# Patient Record
Sex: Female | Born: 1955 | Race: White | Hispanic: No | Smoking: Current every day smoker
Health system: Southern US, Community
[De-identification: ages and names within clinical notes are randomized; demographics above are authoritative.]

## PROBLEM LIST (undated history)

## (undated) DIAGNOSIS — R112 Nausea with vomiting, unspecified: Secondary | ICD-10-CM

## (undated) DIAGNOSIS — IMO0001 Reserved for inherently not codable concepts without codable children: Secondary | ICD-10-CM

## (undated) DIAGNOSIS — M1990 Unspecified osteoarthritis, unspecified site: Secondary | ICD-10-CM

## (undated) DIAGNOSIS — F329 Major depressive disorder, single episode, unspecified: Secondary | ICD-10-CM

## (undated) DIAGNOSIS — G4730 Sleep apnea, unspecified: Secondary | ICD-10-CM

## (undated) DIAGNOSIS — J449 Chronic obstructive pulmonary disease, unspecified: Secondary | ICD-10-CM

## (undated) DIAGNOSIS — E039 Hypothyroidism, unspecified: Secondary | ICD-10-CM

## (undated) DIAGNOSIS — K219 Gastro-esophageal reflux disease without esophagitis: Secondary | ICD-10-CM

## (undated) DIAGNOSIS — F32A Depression, unspecified: Secondary | ICD-10-CM

## (undated) DIAGNOSIS — A048 Other specified bacterial intestinal infections: Secondary | ICD-10-CM

## (undated) DIAGNOSIS — Z9889 Other specified postprocedural states: Secondary | ICD-10-CM

## (undated) DIAGNOSIS — I10 Essential (primary) hypertension: Secondary | ICD-10-CM

## (undated) HISTORY — PX: TUBAL LIGATION: SHX77

## (undated) HISTORY — PX: OTHER SURGICAL HISTORY: SHX169

## (undated) HISTORY — PX: JOINT REPLACEMENT: SHX530

## (undated) HISTORY — PX: CHOLECYSTECTOMY: SHX55

## (undated) HISTORY — DX: Other specified bacterial intestinal infections: A04.8

## (undated) HISTORY — PX: COLONOSCOPY: SHX174

## (undated) HISTORY — PX: ABDOMINAL HYSTERECTOMY: SHX81

## (undated) HISTORY — PX: DIAGNOSTIC LAPAROSCOPY: SUR761

## (undated) HISTORY — PX: LEG SURGERY: SHX1003

## (undated) HISTORY — PX: TONSILLECTOMY: SUR1361

## (undated) HISTORY — PX: COLONOSCOPY WITH ESOPHAGOGASTRODUODENOSCOPY (EGD): SHX5779

## (undated) HISTORY — PX: APPENDECTOMY: SHX54

---

## 2007-11-03 ENCOUNTER — Encounter: Admission: RE | Admit: 2007-11-03 | Discharge: 2007-11-03 | Payer: Self-pay | Admitting: Orthopedic Surgery

## 2008-08-10 ENCOUNTER — Inpatient Hospital Stay (HOSPITAL_COMMUNITY): Admission: RE | Admit: 2008-08-10 | Discharge: 2008-08-10 | Payer: Self-pay | Admitting: Orthopedic Surgery

## 2009-03-21 ENCOUNTER — Encounter: Payer: Self-pay | Admitting: Infectious Disease

## 2009-03-27 ENCOUNTER — Encounter: Payer: Self-pay | Admitting: Infectious Disease

## 2009-03-29 ENCOUNTER — Encounter (INDEPENDENT_AMBULATORY_CARE_PROVIDER_SITE_OTHER): Payer: Self-pay | Admitting: *Deleted

## 2009-03-29 DIAGNOSIS — F17200 Nicotine dependence, unspecified, uncomplicated: Secondary | ICD-10-CM

## 2009-04-03 ENCOUNTER — Ambulatory Visit: Payer: Self-pay | Admitting: Infectious Disease

## 2009-04-03 DIAGNOSIS — L819 Disorder of pigmentation, unspecified: Secondary | ICD-10-CM | POA: Insufficient documentation

## 2009-04-03 DIAGNOSIS — F0630 Mood disorder due to known physiological condition, unspecified: Secondary | ICD-10-CM | POA: Insufficient documentation

## 2009-04-03 LAB — CONVERTED CEMR LAB
Basophils Relative: 1 % (ref 0–1)
CO2: 25 meq/L (ref 19–32)
CRP: 0.4 mg/dL (ref <0.6)
Chloride: 104 meq/L (ref 96–112)
Creatinine, Ser: 0.69 mg/dL (ref 0.40–1.20)
Eosinophils Absolute: 0.2 10*3/uL (ref 0.0–0.7)
Eosinophils Relative: 2 % (ref 0–5)
Glucose, Bld: 100 mg/dL — ABNORMAL HIGH (ref 70–99)
Hemoglobin: 15.4 g/dL — ABNORMAL HIGH (ref 12.0–15.0)
Hep B S Ab: NEGATIVE
MCV: 95.8 fL (ref >=78.0)
Monocytes Relative: 9 % (ref 3–12)
Neutro Abs: 3.9 10*3/uL (ref 1.7–7.7)
Neutrophils Relative %: 51 % (ref 43–77)
Potassium: 4.5 meq/L (ref 3.5–5.3)
RDW: 12.9 % (ref 11.5–15.5)
Sed Rate: 18 mm/hr (ref 0–22)
Total Bilirubin: 0.4 mg/dL (ref 0.3–1.2)
Total Protein: 7 g/dL (ref 6.0–8.3)

## 2009-04-06 ENCOUNTER — Telehealth: Payer: Self-pay | Admitting: Infectious Disease

## 2009-04-11 ENCOUNTER — Ambulatory Visit (HOSPITAL_COMMUNITY): Admission: RE | Admit: 2009-04-11 | Discharge: 2009-04-11 | Payer: Self-pay | Admitting: Infectious Disease

## 2009-04-16 ENCOUNTER — Telehealth: Payer: Self-pay | Admitting: Infectious Disease

## 2009-04-23 ENCOUNTER — Telehealth: Payer: Self-pay | Admitting: Infectious Disease

## 2009-04-30 ENCOUNTER — Ambulatory Visit: Payer: Self-pay | Admitting: Infectious Disease

## 2009-04-30 DIAGNOSIS — Z22322 Carrier or suspected carrier of Methicillin resistant Staphylococcus aureus: Secondary | ICD-10-CM

## 2009-04-30 DIAGNOSIS — D751 Secondary polycythemia: Secondary | ICD-10-CM

## 2009-05-02 ENCOUNTER — Encounter: Payer: Self-pay | Admitting: Infectious Disease

## 2009-06-22 ENCOUNTER — Telehealth (INDEPENDENT_AMBULATORY_CARE_PROVIDER_SITE_OTHER): Payer: Self-pay | Admitting: *Deleted

## 2009-07-19 ENCOUNTER — Ambulatory Visit (HOSPITAL_COMMUNITY): Admission: RE | Admit: 2009-07-19 | Discharge: 2009-07-19 | Payer: Self-pay | Admitting: Orthopedic Surgery

## 2009-07-31 ENCOUNTER — Encounter (INDEPENDENT_AMBULATORY_CARE_PROVIDER_SITE_OTHER): Payer: Self-pay | Admitting: Orthopedic Surgery

## 2009-07-31 ENCOUNTER — Ambulatory Visit (HOSPITAL_COMMUNITY): Admission: RE | Admit: 2009-07-31 | Discharge: 2009-07-31 | Payer: Self-pay | Admitting: Orthopedic Surgery

## 2010-03-26 ENCOUNTER — Inpatient Hospital Stay (HOSPITAL_COMMUNITY): Admission: RE | Admit: 2010-03-26 | Discharge: 2010-03-27 | Payer: Self-pay | Admitting: Orthopedic Surgery

## 2010-08-05 ENCOUNTER — Encounter: Payer: Self-pay | Admitting: Orthopedic Surgery

## 2010-08-11 LAB — CONVERTED CEMR LAB
Basophils Absolute: 0 10*3/uL (ref 0.0–0.1)
CO2: 26 meq/L (ref 19–32)
CRP: 0.9 mg/dL — ABNORMAL HIGH (ref <0.6)
Calcium: 9.7 mg/dL (ref 8.4–10.5)
Creatinine, Ser: 0.71 mg/dL (ref 0.40–1.20)
Glucose, Bld: 82 mg/dL (ref 70–99)
Lymphs Abs: 2.5 10*3/uL (ref 0.7–4.0)
MCHC: 32.5 g/dL (ref 30.0–36.0)
Monocytes Absolute: 0.7 10*3/uL (ref 0.1–1.0)
Monocytes Relative: 9 % (ref 3–12)
Platelets: 280 10*3/uL (ref 150–400)
Potassium: 4.7 meq/L (ref 3.5–5.3)
RBC: 4.62 M/uL (ref 3.87–5.11)
RDW: 13.1 % (ref 11.5–15.5)
WBC: 8 10*3/uL (ref 4.0–10.5)

## 2010-08-15 NOTE — Consult Note (Signed)
Summary: [facility]  [facility]   Imported By: Florinda Marker 07/25/2009 14:39:11  _____________________________________________________________________  External Attachment:    Type:   Image     Comment:   External Document

## 2010-08-15 NOTE — Consult Note (Signed)
Summary: [facility]  [facility]   Imported By: Florinda Marker 07/24/2009 14:07:20  _____________________________________________________________________  External Attachment:    Type:   Image     Comment:   External Document

## 2010-09-26 LAB — BASIC METABOLIC PANEL
BUN: 12 mg/dL (ref 6–23)
CO2: 25 mEq/L (ref 19–32)
CO2: 32 mEq/L (ref 19–32)
Calcium: 8.2 mg/dL — ABNORMAL LOW (ref 8.4–10.5)
Calcium: 9.6 mg/dL (ref 8.4–10.5)
Creatinine, Ser: 0.67 mg/dL (ref 0.4–1.2)
GFR calc Af Amer: >60 mL/min (ref >60)
GFR calc Af Amer: >60 mL/min (ref >60)
GFR calc non Af Amer: >60 mL/min (ref >60)
GFR calc non Af Amer: >60 mL/min (ref >60)
Glucose, Bld: 100 mg/dL — ABNORMAL HIGH (ref 70–99)
Glucose, Bld: 147 mg/dL — ABNORMAL HIGH (ref 70–99)

## 2010-09-26 LAB — URINALYSIS, ROUTINE W REFLEX MICROSCOPIC
Bilirubin Urine: NEGATIVE
Glucose, UA: NEGATIVE mg/dL
Ketones, ur: NEGATIVE mg/dL
Nitrite: NEGATIVE
Specific Gravity, Urine: 1.016 (ref 1.005–1.030)

## 2010-09-26 LAB — PROTIME-INR
INR: 0.97 (ref 0.00–1.49)
Prothrombin Time: 13.1 seconds (ref 11.6–15.2)

## 2010-09-26 LAB — DIFFERENTIAL
Basophils Absolute: 0.1 10*3/uL (ref 0.0–0.1)
Basophils Relative: 1 % (ref 0–1)
Eosinophils Absolute: 0.2 10*3/uL (ref 0.0–0.7)
Lymphocytes Relative: 31 % (ref 12–46)
Lymphs Abs: 2.5 10*3/uL (ref 0.7–4.0)
Monocytes Absolute: 0.8 10*3/uL (ref 0.1–1.0)

## 2010-09-26 LAB — TYPE AND SCREEN: ABO/RH(D): O POS

## 2010-09-26 LAB — CBC
HCT: 39.7 % (ref 36.0–46.0)
Hemoglobin: 11.5 g/dL — ABNORMAL LOW (ref 12.0–15.0)
MCH: 33.8 pg (ref 26.0–34.0)
MCHC: 35.2 g/dL (ref 30.0–36.0)
MCHC: 35.4 g/dL (ref 30.0–36.0)
RDW: 12.3 % (ref 11.5–15.5)
WBC: 8.1 10*3/uL (ref 4.0–10.5)

## 2010-09-26 LAB — URINE MICROSCOPIC-ADD ON

## 2010-09-29 LAB — URINE CULTURE: Colony Count: 75000

## 2010-09-29 LAB — ANAEROBIC CULTURE
Gram Stain: NONE SEEN
Gram Stain: NONE SEEN

## 2010-09-29 LAB — GRAM STAIN
Gram Stain: NONE SEEN
Gram Stain: NONE SEEN

## 2010-09-29 LAB — TISSUE CULTURE: Culture: NO GROWTH

## 2010-09-29 LAB — DIFFERENTIAL
Basophils Absolute: 0.1 10*3/uL (ref 0.0–0.1)
Eosinophils Relative: 2 % (ref 0–5)
Lymphocytes Relative: 35 % (ref 12–46)
Lymphocytes Relative: 37 % (ref 12–46)
Lymphs Abs: 3 10*3/uL (ref 0.7–4.0)
Lymphs Abs: 3.4 10*3/uL (ref 0.7–4.0)
Monocytes Absolute: 0.5 10*3/uL (ref 0.1–1.0)
Monocytes Relative: 6 % (ref 3–12)
Neutro Abs: 4.8 10*3/uL (ref 1.7–7.7)
Neutrophils Relative %: 54 % (ref 43–77)

## 2010-09-29 LAB — CBC
HCT: 41.8 % (ref 36.0–46.0)
MCHC: 34.8 g/dL (ref 30.0–36.0)
RBC: 4.32 MIL/uL (ref 3.87–5.11)
RDW: 13.1 % (ref 11.5–15.5)
WBC: 8.8 10*3/uL (ref 4.0–10.5)

## 2010-09-29 LAB — URINALYSIS, ROUTINE W REFLEX MICROSCOPIC
Bilirubin Urine: NEGATIVE
Glucose, UA: NEGATIVE mg/dL
Ketones, ur: NEGATIVE mg/dL
Ketones, ur: NEGATIVE mg/dL
Nitrite: NEGATIVE
Specific Gravity, Urine: 1.015 (ref 1.005–1.030)
pH: 6 (ref 5.0–8.0)
pH: 6 (ref 5.0–8.0)

## 2010-09-29 LAB — COMPREHENSIVE METABOLIC PANEL
ALT: 26 U/L (ref 0–35)
AST: 23 U/L (ref 0–37)
CO2: 31 mEq/L (ref 19–32)
Calcium: 9.5 mg/dL (ref 8.4–10.5)
Calcium: 9.7 mg/dL (ref 8.4–10.5)
Creatinine, Ser: 0.69 mg/dL (ref 0.4–1.2)
GFR calc Af Amer: >60 mL/min (ref >60)
GFR calc non Af Amer: >60 mL/min (ref >60)
Glucose, Bld: 94 mg/dL (ref 70–99)
Sodium: 140 mEq/L (ref 135–145)
Sodium: 140 mEq/L (ref 135–145)
Total Protein: 6.6 g/dL (ref 6.0–8.3)
Total Protein: 7.1 g/dL (ref 6.0–8.3)

## 2010-09-29 LAB — AFB CULTURE WITH SMEAR (NOT AT ARMC): Acid Fast Smear: NONE SEEN

## 2010-09-29 LAB — APTT
aPTT: 37 seconds (ref 24–37)
aPTT: 37 seconds (ref 24–37)

## 2010-09-29 LAB — URINE MICROSCOPIC-ADD ON

## 2010-09-29 LAB — SEDIMENTATION RATE: Sed Rate: 28 mm/hr — ABNORMAL HIGH (ref 0–22)

## 2010-09-29 LAB — HIGH SENSITIVITY CRP: CRP, High Sensitivity: 8.6 mg/L — ABNORMAL HIGH

## 2010-09-29 LAB — PROTIME-INR
INR: 0.93 (ref 0.00–1.49)
Prothrombin Time: 12.4 seconds (ref 11.6–15.2)

## 2010-09-29 LAB — FUNGUS CULTURE W SMEAR

## 2010-09-29 LAB — WOUND CULTURE: Culture: NO GROWTH

## 2010-09-29 LAB — TYPE AND SCREEN: ABO/RH(D): O POS

## 2010-10-28 LAB — URINE CULTURE: Colony Count: >=100000

## 2010-10-28 LAB — CBC
HCT: 40 % (ref 36.0–46.0)
HCT: 41 % (ref 36.0–46.0)
Hemoglobin: 14.4 g/dL (ref 12.0–15.0)
MCV: 92.9 fL (ref 78.0–100.0)
Platelets: 252 10*3/uL (ref 150–400)
RBC: 4.2 MIL/uL (ref 3.87–5.11)
WBC: 7.8 10*3/uL (ref 4.0–10.5)
WBC: 8.4 10*3/uL (ref 4.0–10.5)

## 2010-10-28 LAB — DIFFERENTIAL
Basophils Absolute: 0 10*3/uL (ref 0.0–0.1)
Basophils Relative: 1 % (ref 0–1)
Eosinophils Absolute: 0.1 10*3/uL (ref 0.0–0.7)
Monocytes Relative: 7 % (ref 3–12)
Neutrophils Relative %: 63 % (ref 43–77)

## 2010-10-28 LAB — COMPREHENSIVE METABOLIC PANEL
Alkaline Phosphatase: 95 U/L (ref 39–117)
BUN: 10 mg/dL (ref 6–23)
CO2: 30 mEq/L (ref 19–32)
Chloride: 102 mEq/L (ref 96–112)
GFR calc non Af Amer: >60 mL/min (ref >60)
Glucose, Bld: 108 mg/dL — ABNORMAL HIGH (ref 70–99)
Potassium: 4.3 mEq/L (ref 3.5–5.1)
Total Bilirubin: 0.8 mg/dL (ref 0.3–1.2)

## 2010-10-28 LAB — GRAM STAIN

## 2010-10-28 LAB — ANAEROBIC CULTURE: Gram Stain: NONE SEEN

## 2010-10-28 LAB — TISSUE CULTURE

## 2010-10-28 LAB — URINALYSIS, ROUTINE W REFLEX MICROSCOPIC
Glucose, UA: NEGATIVE mg/dL
Ketones, ur: NEGATIVE mg/dL
Protein, ur: NEGATIVE mg/dL

## 2010-10-28 LAB — BASIC METABOLIC PANEL
BUN: 9 mg/dL (ref 6–23)
Creatinine, Ser: 0.6 mg/dL (ref 0.4–1.2)
GFR calc Af Amer: >60 mL/min (ref >60)
GFR calc non Af Amer: >60 mL/min (ref >60)
Potassium: 4.7 mEq/L (ref 3.5–5.1)

## 2010-10-28 LAB — C-REACTIVE PROTEIN: CRP: 1.1 mg/dL — ABNORMAL HIGH (ref <0.6)

## 2010-10-28 LAB — CULTURE, ROUTINE-ABSCESS: Culture: NO GROWTH

## 2010-10-28 LAB — URINE MICROSCOPIC-ADD ON

## 2010-10-28 LAB — SEDIMENTATION RATE: Sed Rate: 20 mm/hr (ref 0–22)

## 2010-11-26 NOTE — Op Note (Signed)
Jessica Lam, Jessica Lam                  ACCOUNT NO.:  [id number]   MEDICAL RECORD NO.:  [id number]          PATIENT TYPE:  INP   LOCATION:  2899                         FACILITY:  [facility]   PHYSICIAN:  Doralee Albino. Carola Frost, M.D. DATE OF BIRTH:  Dec 12, 1955   DATE OF PROCEDURE:  08/10/2008  DATE OF DISCHARGE:  08/10/2008                               OPERATIVE REPORT   PREOPERATIVE DIAGNOSIS:  Right knee infected hardware.   POSTOPERATIVE DIAGNOSES:  1. Right knee subcutaneous abscesses.  2. Retained hardware, right proximal tibia.   PROCEDURES:  1. Removal of deep implant.  2. Debridement and excision of subcutaneous abscesses.   SURGEON:  Doralee Albino. Carola Frost, MD   ASSISTANT:  Mearl Latin, PA   ANESTHESIA:  General.   COMPLICATIONS:  None.   FINDINGS:  Subcutaneous abscesses, 1 cm x  1 cm (x2).   SPECIMENS:  Three, one from each subcutaneous abscess to micro and one  from the proximal tibia.   GROSS FINDINGS:  No visible evidence of infection directly around the  plate with well-fixed hardware.   ESTIMATED BLOOD LOSS:  50 mL.   DISPOSITION:  PACU.   CONDITION:  Stable.   BRIEF SUMMARY AND INDICATIONS FOR PROCEDURE:  Jessica Lam is a 55-year-  old female status post ORIF of her right tibial plateau fracture nearly  2 years ago.  She had the postoperative course complicated by draining  infection, but ultimately resolved this.  She is continued to have pain  and inflammatory markers were elevated suggestive of infection.  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  There did appear to be areas that perhaps had gone on to  unite, but these were quite small and could be clinically insignificant.  She also has developed changes consistent with posttraumatic arthritis.  We discussed  preoperatively the risk and benefits of hardware removal  with or without bone grafting and attempted repair of her nonunion.  Given the elevated markers, we chose to stage this with removal of  hardware and debridement of what ever infection may be present followed  by repair of the nonunion or perhaps even total knee replacement should  we be convinced the infection is totally resolved up.  Jessica Lam wished  to proceed understanding the risks to include persistent infection, need  for further surgery, failure to improve pain or symptoms, and multiple  others including DVT, heart attack, and stroke.   BRIEF DESCRIPTION OF PROCEDURE:  Jessica Lam was not administered preop  antibiotics, but did receive just a dropper through the IV line before  this was officially held or clamped off.  She was taken to the operating  room where general anesthesia was induced.  The right lower extremity  was prepped and draped in the usual sterile fashion.  Evaluation under  anesthesia did not demonstrate any gross evidence of infection.  Medial  and lateral incisions  were well healed with no fluctuance.  The knee was  also stable to varus-valgus stress at full extension and 30 degrees of  flexion.  She did have nearly a 15-degree flexion contracture.   After thorough prep and drape, we remade the lateral incision and  encountered in the proximal aspect a subcutaneous abscess which measured  at least 1 cm x 1 cm and did have pus within it.  We cultured this, both  anaerobic and aerobic and excised it in its entirety after putting a  Jessica Lam around it.  Further down, we encountered another pocket of  these subcutaneous abscesses and also measuring about 1 cm x 1 cm and we  were suspicious that this could have been an old suture abscess as well.  It was excised in its entirety and then cut on the back table also  revealing purulence.  This material was sent for anaerobic and aerobic  cultures as well.  We  thoroughly irrigated with several liters of saline  and bulb syringe and then changed gloves and applied fresh sterile drape  and then exposed the deep hardware.  The hardware was well fixed and was  removed without difficulty and curettes passed into the bone and the  cavity below the central medial compartment.  No purulence of any kind  was encountered and the bone edges seemed quite stable.  Again, no  evidence of infection, but cultures were obtained from the bone and sent  to micro also.  Deep tissues were copiously irrigated and then 3 deep 0  PDS sutures were used to reapproximate the anterior compartment fascia  at its proximal edge and it was left largely open longitudinally after  again just a tacking suture at the most proximal aspect.  There was no  significant bleeding of any kind and no significant edema or trauma to  the anterior compartment.  The 2-0 PDS was used for the subcu and  staples for the skin.  Sterile gently compressive dressing was applied.  The patient was awakened from anesthesia and transported to PACU in  stable condition.  Montez Morita, PA-C, assisted throughout the procedure  with retraction and also removal of hardware as well as simultaneous  wound closure.   PROGNOSIS:  Jessica Lam will be weightbearing as tolerated with no  restrictions.  We will wait cultures, but we will put her  prophylactically on broad-spectrum oral antibiotics as again this seems  to be a subcutaneous tissue that has been entirely debrided.  We will  recheck inflammatory markers after she has completed this 2-week course  and at that time, we can discuss whether to proceed with repair of her  nonunion versus possibility that her symptoms will improve sufficiently  that she will not request of further surgery.      Doralee Albino. Carola Frost, M.D.  Electronically Signed     [facility]  D:  08/10/2008  T:  08/10/2008  Job:  [id number]

## 2011-09-15 IMAGING — CR DG CHEST 2V
2 series · 2 of 2 positions shown · non-contrast
Comparison: 08/08/2008

CLINICAL DATA: Preoperative chest x-ray for right tibial plateau
nonunion versus osteomyelitis. Smoker with COPD and hypertension

CHEST - 2 VIEW

[view not recorded (1 of 2)]
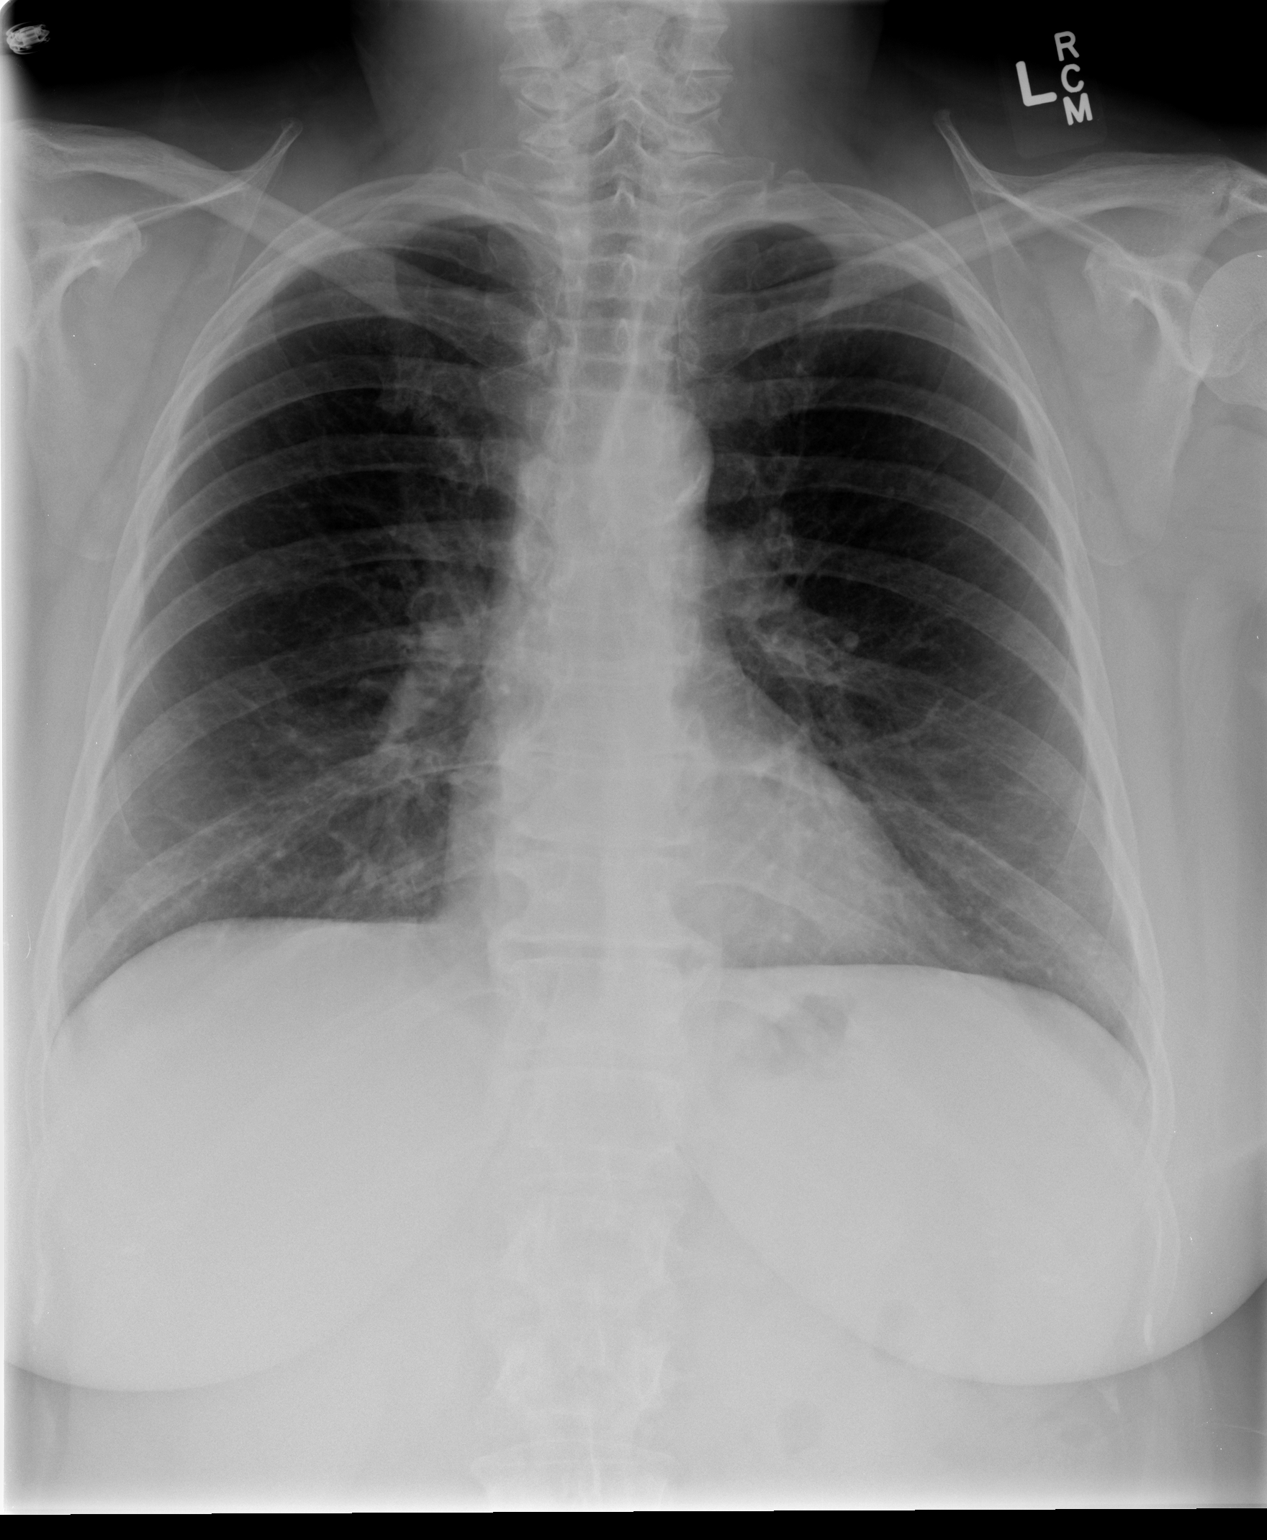

[view not recorded (2 of 2)]
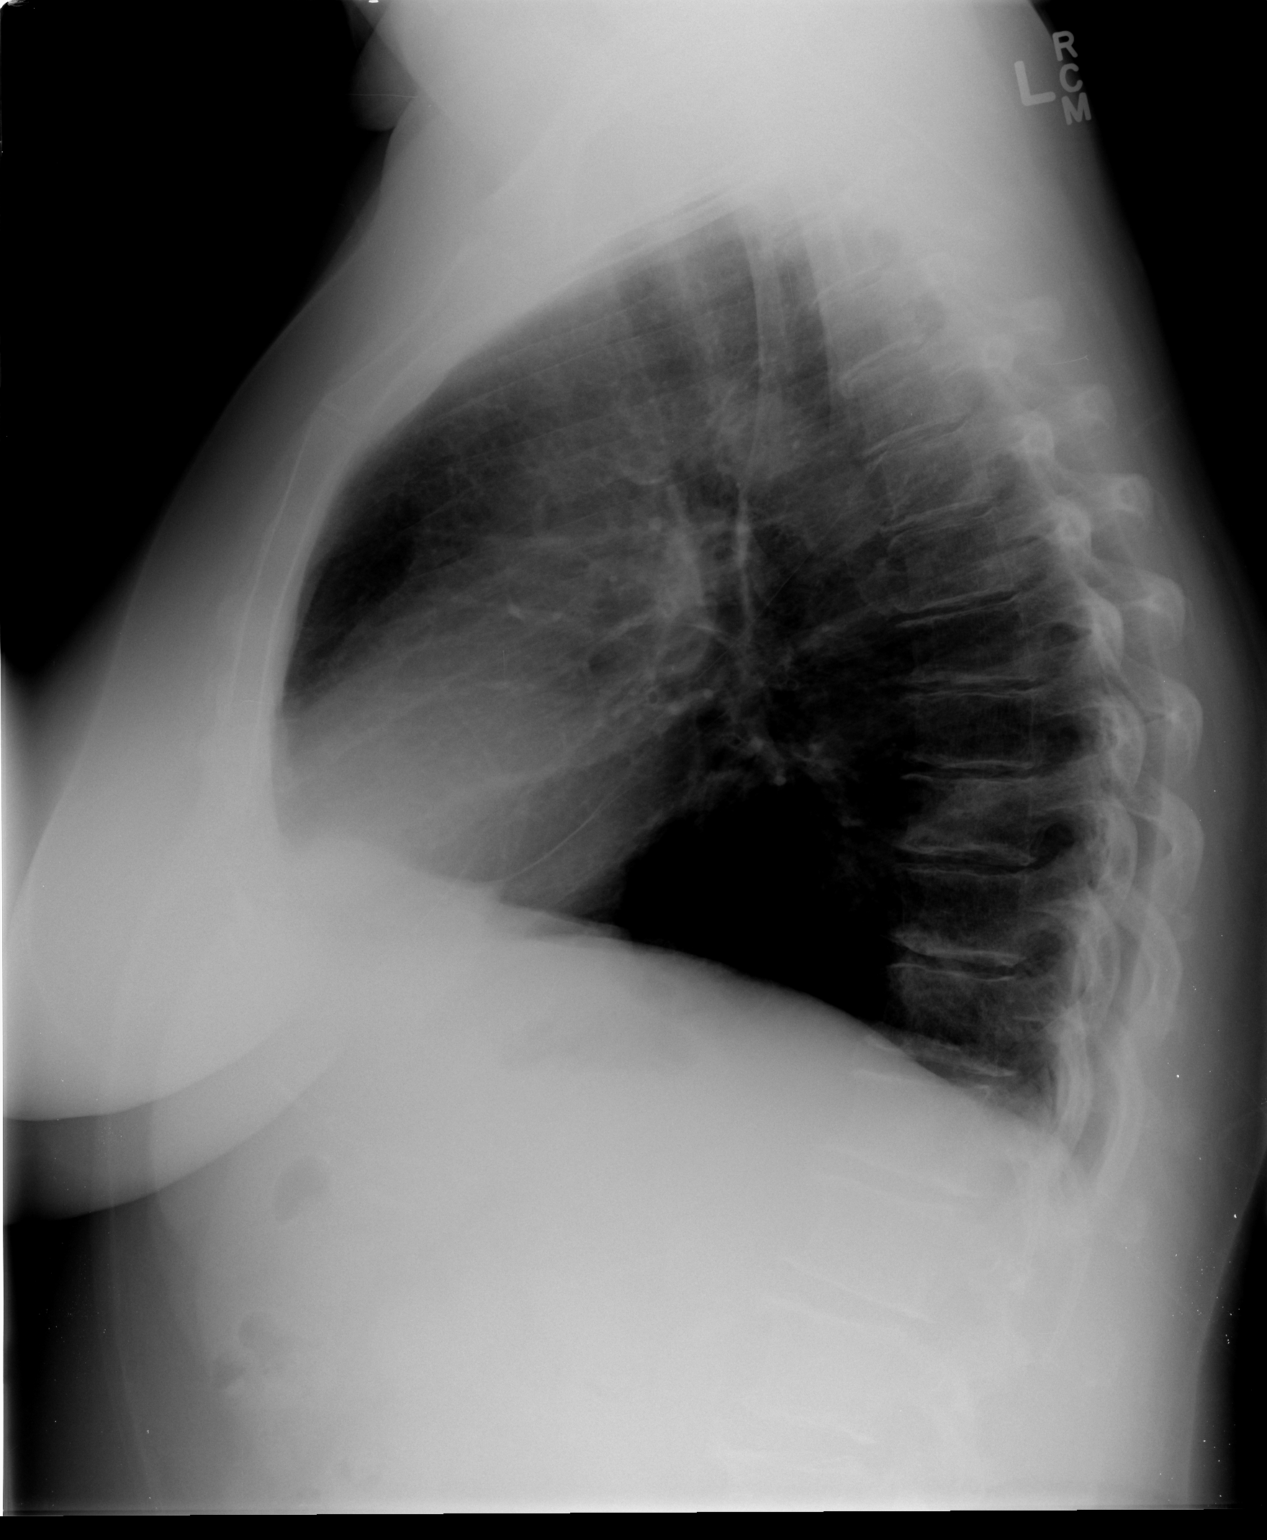

[2 of 2 positions shown; findings below may reference images not displayed]

FINDINGS: Mild COPD changes are present and taking this into
consideration heart size is upper limits of normal.  A normal
mediastinal configuration is seen.

The lung fields are notable for mild peribronchial cuffing which is
stable.  No focal infiltrates or signs of congestive failure seen.
No pleural fluid is noted.

Bony structures demonstrate mild diffuse degenerative osteophytosis
of the thoracic spine and are otherwise intact.
IMPRESSION: Stable cardiopulmonary appearance with no acute abnormality noted.

## 2016-02-11 ENCOUNTER — Encounter (HOSPITAL_COMMUNITY)
Admission: RE | Admit: 2016-02-11 | Discharge: 2016-02-11 | Disposition: A | Discharge status: Home / Self Care | Payer: Medicare Other | Source: Ambulatory Visit | Attending: Orthopedic Surgery | Admitting: Orthopedic Surgery

## 2016-02-11 ENCOUNTER — Encounter (HOSPITAL_COMMUNITY): Payer: Self-pay | Admitting: *Deleted

## 2016-02-11 DIAGNOSIS — Y838 Other surgical procedures as the cause of abnormal reaction of the patient, or of later complication, without mention of misadventure at the time of the procedure: Secondary | ICD-10-CM | POA: Diagnosis not present

## 2016-02-11 DIAGNOSIS — Z01812 Encounter for preprocedural laboratory examination: Secondary | ICD-10-CM | POA: Insufficient documentation

## 2016-02-11 DIAGNOSIS — T84092A Other mechanical complication of internal right knee prosthesis, initial encounter: Secondary | ICD-10-CM | POA: Insufficient documentation

## 2016-02-11 DIAGNOSIS — Z0183 Encounter for blood typing: Secondary | ICD-10-CM | POA: Diagnosis not present

## 2016-02-11 HISTORY — DX: Major depressive disorder, single episode, unspecified: F32.9

## 2016-02-11 HISTORY — DX: Chronic obstructive pulmonary disease, unspecified: J44.9

## 2016-02-11 HISTORY — DX: Unspecified osteoarthritis, unspecified site: M19.90

## 2016-02-11 HISTORY — DX: Essential (primary) hypertension: I10

## 2016-02-11 HISTORY — DX: Other specified postprocedural states: Z98.890

## 2016-02-11 HISTORY — DX: Depression, unspecified: F32.A

## 2016-02-11 HISTORY — DX: Nausea with vomiting, unspecified: R11.2

## 2016-02-11 HISTORY — DX: Sleep apnea, unspecified: G47.30

## 2016-02-11 HISTORY — DX: Reserved for inherently not codable concepts without codable children: IMO0001

## 2016-02-11 LAB — SURGICAL PCR SCREEN
MRSA, PCR: NEGATIVE
Staphylococcus aureus: NEGATIVE

## 2016-02-11 NOTE — Pre-Procedure Instructions (Signed)
EKG 01-22-16, labs -CBC/d, CMP, LP, TSH, VIT D. A1C=6.0. With chart.

## 2016-02-11 NOTE — H&P (Signed)
TOTAL KNEE REVISION ADMISSION H&P  Patient is being admitted for right revision total knee arthroplasty and excision of saphenous nerve.  Subjective:  Chief Complaint:  Right knee pain s/p TKA  HPI: Jessica Lam, 60 y.o. female, has a history of pain and functional disability in the right knee(s) due to failed previous arthroplasty and patient has failed non-surgical conservative treatments for greater than 12 weeks to include NSAID's and/or analgesics, use of assistive devices and activity modification. The indications for the revision of the total knee arthroplasty are loosening of one or more components. Onset of symptoms was gradual starting months ago with rapidlly worsening course since that time.  Prior procedures on the right knee(s) include arthroplasty.  Patient currently rates pain in the right knee(s) at 6 out of 10 with activity. There is worsening of pain with activity and weight bearing, pain that interferes with activities of daily living, pain with passive range of motion, crepitus and joint swelling.  Patient has evidence of prosthetic loosening by imaging studies. This condition presents safety issues increasing the risk of falls.  There is no current active infection.  Risks, benefits and expectations were discussed with the patient.  Risks including but not limited to the risk of anesthesia, blood clots, nerve damage, blood vessel damage, failure of the prosthesis, infection and up to and including death.  Patient understand the risks, benefits and expectations and wishes to proceed with surgery.   PCP: Acey Lav, MD  D/C Plans:      Home  Post-op Meds:       No Rx given  Tranexamic Acid:      To be given - IV  Decadron:      Is to be given  FYI:     ASA  Norco 10-20  Nicotine 14 mg    Patient Active Problem List   Diagnosis Date Noted  . ERYTHROCYTOSIS 04/30/2009  . 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 TATTOOING 04/03/2009  . SMOKER 03/29/2009   No past medical history on file.  No past surgical history on file.  No prescriptions prior to admission.   Allergies  Allergen Reactions  . Codeine   . Penicillins     REACTION: rash in remote past.    Social History  Substance Use Topics  . Smoking status: Not on file  . Smokeless tobacco: Not on file  . Alcohol use Not on file       Review of Systems  Constitutional: Negative.   HENT: Negative.   Eyes: Negative.   Respiratory: Positive for cough and shortness of breath (with exertion).   Cardiovascular: Negative.   Gastrointestinal: Negative.   Genitourinary: Positive for frequency and urgency.  Musculoskeletal: Positive for joint pain.  Skin: Negative.   Neurological: Negative.   Endo/Heme/Allergies: Negative.   Psychiatric/Behavioral: Positive for depression. The patient is nervous/anxious.      Objective:  Physical Exam  Constitutional: She is oriented to person, place, and time. She appears well-developed.  HENT:  Head: Normocephalic.  Eyes: Pupils are equal, round, and reactive to light.  Neck: Neck supple. No JVD present. No tracheal deviation present. No thyromegaly present.  Cardiovascular: Normal rate, regular rhythm, normal heart sounds and intact distal pulses.   Respiratory: Effort normal and breath sounds normal. No respiratory distress. She has no wheezes.  GI: Soft. There is no tenderness. There is no guarding.  Musculoskeletal:  Right knee: She exhibits decreased range of motion, swelling and laceration (healed previous incision). She exhibits no ecchymosis, no deformity and no erythema. Tenderness found.  Lymphadenopathy:    She has no cervical adenopathy.  Neurological: She is alert and oriented to person, place, and time.  Skin: Skin is warm and dry.  Psychiatric: She has a normal mood and affect.      Labs:  Estimated body mass index is 41.38 kg/m as  calculated from the following:   Height as of 04/30/09:  (1.549 m).   Weight as of 04/30/09: 99.3 kg (219 lb).  Imaging Review Plain radiographs demonstrate previous TKA of the right knee(s).  The bone quality appears to be good for age and reported activity level.  Assessment/Plan:  Right knee with failed previous arthroplasty.   The patient history, physical examination, clinical judgment of the provider and imaging studies are consistent with failure of the right knee(s), previous total knee arthroplasty. Revision total knee arthroplasty is deemed medically necessary. The treatment options including medical management, injection therapy, arthroscopy and revision arthroplasty were discussed at length. The risks and benefits of revision total knee arthroplasty were presented and reviewed. The risks due to aseptic loosening, infection, stiffness, patella tracking problems, thromboembolic complications and other imponderables were discussed. The patient acknowledged the explanation, agreed to proceed with the plan and consent was signed. Patient is being admitted for inpatient treatment for surgery, pain control, PT, OT, prophylactic antibiotics, VTE prophylaxis, progressive ambulation and ADL's and discharge planning.The patient is planning to be discharged home.      Anastasio Auerbach Nyeema Want   PA-C  02/11/2016, 9:43 AM

## 2016-02-11 NOTE — Patient Instructions (Signed)
Jessica Lam  02/11/2016   Your procedure is scheduled on: 02-18-16   Report to [facility] Main  Entrance take [facility]  elevators to 3rd floor to  Short Stay Center at  0830 AM.  Call this number if you have problems the morning of surgery [phone]   Remember: ONLY 1 PERSON MAY GO WITH YOU TO SHORT STAY TO GET  READY MORNING OF YOUR SURGERY.  Do not eat food or drink liquids :After Midnight.     Take these medicines the morning of surgery with A SIP OF WATER: Norco, Use Inhaler. Nebulizer as needed. DO NOT TAKE ANY DIABETIC MEDICATIONS DAY OF YOUR SURGERY                               You may not have any metal on your body including hair pins and              piercings  Do not wear jewelry, make-up, lotions, powders or perfumes, deodorant             Do not wear nail polish.  Do not shave  48 hours prior to surgery.              Men may shave face and neck.   Do not bring valuables to the hospital. [facility] IS NOT             RESPONSIBLE   FOR VALUABLES.  Contacts, dentures or bridgework may not be worn into surgery.  Leave suitcase in the car. After surgery it may be brought to your room.     Patients discharged the day of surgery will not be allowed to drive home.  Name and phone number of your driver: Ottis Stain- daughter [phone] cell  Special Instructions: N/A              Please read over the following fact sheets you were given: _____________________________________________________________________             [facility] - Preparing for Surgery Before surgery, you can play an important role.  Because skin is not sterile, your skin needs to be as free of germs as possible.  You can reduce the number of germs on your skin by washing with CHG (chlorahexidine gluconate) soap before surgery.  CHG is an antiseptic cleaner which kills germs and bonds with the skin to continue killing germs even after washing. Please DO NOT use if you have an  allergy to CHG or antibacterial soaps.  If your skin becomes reddened/irritated stop using the CHG and inform your nurse when you arrive at Short Stay. Do not shave (including legs and underarms) for at least 48 hours prior to the first CHG shower.  You may shave your face/neck. Please follow these instructions carefully:  1.  Shower with CHG Soap the night before surgery and the  morning of Surgery.  2.  If you choose to wash your hair, wash your hair first as usual with your  normal  shampoo.  3.  After you shampoo, rinse your hair and body thoroughly to remove the  shampoo.                           4.  Use CHG as you would any other liquid soap.  You  can apply chg directly  to the skin and wash                       Gently with a scrungie or clean washcloth.  5.  Apply the CHG Soap to your body ONLY FROM THE NECK DOWN.   Do not use on face/ open                           Wound or open sores. Avoid contact with eyes, ears mouth and genitals (private parts).                       Wash face,  Genitals (private parts) with your normal soap.             6.  Wash thoroughly, paying special attention to the area where your surgery  will be performed.  7.  Thoroughly rinse your body with warm water from the neck down.  8.  DO NOT shower/wash with your normal soap after using and rinsing off  the CHG Soap.                9.  Pat yourself dry with a clean towel.            10.  Wear clean pajamas.            11.  Place clean sheets on your bed the night of your first shower and do not  sleep with pets. Day of Surgery : Do not apply any lotions/deodorants the morning of surgery.  Please wear clean clothes to the hospital/surgery center.  FAILURE TO FOLLOW THESE INSTRUCTIONS MAY RESULT IN THE CANCELLATION OF YOUR SURGERY PATIENT SIGNATURE_________________________________  NURSE  SIGNATURE__________________________________  ________________________________________________________________________   Adam Phenix  An incentive spirometer is a tool that can help keep your lungs clear and active. This tool measures how well you are filling your lungs with each breath. Taking long deep breaths may help reverse or decrease the chance of developing breathing (pulmonary) problems (especially infection) following:  A long period of time when you are unable to move or be active. BEFORE THE PROCEDURE   If the spirometer includes an indicator to show your best effort, your nurse or respiratory therapist will set it to a desired goal.  If possible, sit up straight or lean slightly forward. Try not to slouch.  Hold the incentive spirometer in an upright position. INSTRUCTIONS FOR USE  1. Sit on the edge of your bed if possible, or sit up as far as you can in bed or on a chair. 2. Hold the incentive spirometer in an upright position. 3. Breathe out normally. 4. Place the mouthpiece in your mouth and seal your lips tightly around it. 5. Breathe in slowly and as deeply as possible, raising the piston or the ball toward the top of the column. 6. Hold your breath for 3-5 seconds or for as long as possible. Allow the piston or ball to fall to the bottom of the column. 7. Remove the mouthpiece from your mouth and breathe out normally. 8. Rest for a few seconds and repeat Steps 1 through 7 at least 10 times every 1-2 hours when you are awake. Take your time and take a few normal breaths between deep breaths. 9. The spirometer may include an indicator to show your best effort. Use the indicator as a goal to work toward during  each repetition. 10. After each set of 10 deep breaths, practice coughing to be sure your lungs are clear. If you have an incision (the cut made at the time of surgery), support your incision when coughing by placing a pillow or rolled up towels firmly  against it. Once you are able to get out of bed, walk around indoors and cough well. You may stop using the incentive spirometer when instructed by your caregiver.  RISKS AND COMPLICATIONS  Take your time so you do not get dizzy or light-headed.  If you are in pain, you may need to take or ask for pain medication before doing incentive spirometry. It is harder to take a deep breath if you are having pain. AFTER USE  Rest and breathe slowly and easily.  It can be helpful to keep track of a log of your progress. Your caregiver can provide you with a simple table to help with this. If you are using the spirometer at home, follow these instructions: [place] IF:   You are having difficultly using the spirometer.  You have trouble using the spirometer as often as instructed.  Your pain medication is not giving enough relief while using the spirometer.  You develop fever of 100.5 F (38.1 C) or higher. SEEK IMMEDIATE MEDICAL CARE IF:   You cough up bloody sputum that had not been present before.  You develop fever of 102 F (38.9 C) or greater.  You develop worsening pain at or near the incision site. MAKE SURE YOU:   Understand these instructions.  Will watch your condition.  Will get help right away if you are not doing well or get worse. Document Released: 11/10/2006 Document Revised: 09/22/2011 Document Reviewed: 01/11/2007 [employer] Patient Information 2014 [employer], [place].   ________________________________________________________________________  WHAT IS A BLOOD TRANSFUSION? Blood Transfusion Information  A transfusion is the replacement of blood or some of its parts. Blood is made up of multiple cells which provide different functions.  Red blood cells carry oxygen and are used for blood loss replacement.  White blood cells fight against infection.  Platelets control bleeding.  Plasma helps clot blood.  Other blood products are available for  specialized needs, such as hemophilia or other clotting disorders. BEFORE THE TRANSFUSION  Who gives blood for transfusions?   Healthy volunteers who are fully evaluated to make sure their blood is safe. This is blood bank blood. Transfusion therapy is the safest it has ever been in the practice of medicine. Before blood is taken from a donor, a complete history is taken to make sure that person has no history of diseases nor engages in risky social behavior (examples are intravenous drug use or sexual activity with multiple partners). The donor's travel history is screened to minimize risk of transmitting infections, such as malaria. The donated blood is tested for signs of infectious diseases, such as HIV and hepatitis. The blood is then tested to be sure it is compatible with you in order to minimize the chance of a transfusion reaction. If you or a relative donates blood, this is often done in anticipation of surgery and is not appropriate for emergency situations. It takes many days to process the donated blood. RISKS AND COMPLICATIONS Although transfusion therapy is very safe and saves many lives, the main dangers of transfusion include:   Getting an infectious disease.  Developing a transfusion reaction. This is an allergic reaction to something in the blood you were given. Every precaution is taken to prevent  this. The decision to have a blood transfusion has been considered carefully by your caregiver before blood is given. Blood is not given unless the benefits outweigh the risks. AFTER THE TRANSFUSION  Right after receiving a blood transfusion, you will usually feel much better and more energetic. This is especially true if your red blood cells have gotten low (anemic). The transfusion raises the level of the red blood cells which carry oxygen, and this usually causes an energy increase.  The nurse administering the transfusion will monitor you carefully for complications. HOME CARE  INSTRUCTIONS  No special instructions are needed after a transfusion. You may find your energy is better. Speak with your caregiver about any limitations on activity for underlying diseases you may have. SEEK MEDICAL CARE IF:   Your condition is not improving after your transfusion.  You develop redness or irritation at the intravenous (IV) site. SEEK IMMEDIATE MEDICAL CARE IF:  Any of the following symptoms occur over the next 12 hours:  Shaking chills.  You have a temperature by mouth above 102 F (38.9 C), not controlled by medicine.  Chest, back, or muscle pain.  People around you feel you are not acting correctly or are confused.  Shortness of breath or difficulty breathing.  Dizziness and fainting.  You get a rash or develop hives.  You have a decrease in urine output.  Your urine turns a dark color or changes to pink, red, or brown. Any of the following symptoms occur over the next 10 days:  You have a temperature by mouth above 102 F (38.9 C), not controlled by medicine.  Shortness of breath.  Weakness after normal activity.  The white part of the eye turns yellow (jaundice).  You have a decrease in the amount of urine or are urinating less often.  Your urine turns a dark color or changes to pink, red, or brown. Document Released: 06/27/2000 Document Revised: 09/22/2011 Document Reviewed: 02/14/2008 [facility] Patient Information 2014 [place], [place].  _______________________________________________________________________

## 2016-02-18 ENCOUNTER — Inpatient Hospital Stay (HOSPITAL_COMMUNITY): Payer: Medicare Other | Admitting: Anesthesiology

## 2016-02-18 ENCOUNTER — Encounter (HOSPITAL_COMMUNITY)
Admission: RE | Disposition: A | Discharge status: Home Health | Payer: Self-pay | Source: Ambulatory Visit | Attending: Orthopedic Surgery

## 2016-02-18 ENCOUNTER — Encounter (HOSPITAL_COMMUNITY): Payer: Self-pay

## 2016-02-18 ENCOUNTER — Inpatient Hospital Stay (HOSPITAL_COMMUNITY)
Admission: RE | Admit: 2016-02-18 | Discharge: 2016-02-19 | DRG: 467 | Disposition: A | Discharge status: Home Health | Payer: Medicare Other | Source: Ambulatory Visit | Attending: Orthopedic Surgery | Admitting: Orthopedic Surgery

## 2016-02-18 DIAGNOSIS — F329 Major depressive disorder, single episode, unspecified: Secondary | ICD-10-CM | POA: Diagnosis present

## 2016-02-18 DIAGNOSIS — T84032A Mechanical loosening of internal right knee prosthetic joint, initial encounter: Secondary | ICD-10-CM | POA: Diagnosis present

## 2016-02-18 DIAGNOSIS — Y792 Prosthetic and other implants, materials and accessory orthopedic devices associated with adverse incidents: Secondary | ICD-10-CM | POA: Diagnosis present

## 2016-02-18 DIAGNOSIS — Z96651 Presence of right artificial knee joint: Secondary | ICD-10-CM

## 2016-02-18 DIAGNOSIS — I10 Essential (primary) hypertension: Secondary | ICD-10-CM | POA: Diagnosis present

## 2016-02-18 DIAGNOSIS — J449 Chronic obstructive pulmonary disease, unspecified: Secondary | ICD-10-CM | POA: Diagnosis present

## 2016-02-18 DIAGNOSIS — Z6841 Body Mass Index (BMI) 40.0 and over, adult: Secondary | ICD-10-CM

## 2016-02-18 DIAGNOSIS — G4730 Sleep apnea, unspecified: Secondary | ICD-10-CM | POA: Diagnosis present

## 2016-02-18 DIAGNOSIS — M25561 Pain in right knee: Secondary | ICD-10-CM | POA: Diagnosis present

## 2016-02-18 DIAGNOSIS — E039 Hypothyroidism, unspecified: Secondary | ICD-10-CM | POA: Diagnosis present

## 2016-02-18 DIAGNOSIS — E669 Obesity, unspecified: Secondary | ICD-10-CM | POA: Diagnosis present

## 2016-02-18 DIAGNOSIS — K219 Gastro-esophageal reflux disease without esophagitis: Secondary | ICD-10-CM | POA: Diagnosis present

## 2016-02-18 DIAGNOSIS — Z96659 Presence of unspecified artificial knee joint: Secondary | ICD-10-CM

## 2016-02-18 HISTORY — PX: TOTAL KNEE REVISION: SHX996

## 2016-02-18 LAB — TYPE AND SCREEN
ABO/RH(D): O POS
Antibody Screen: NEGATIVE

## 2016-02-18 SURGERY — TOTAL KNEE REVISION
Anesthesia: General | Site: Knee | Laterality: Right

## 2016-02-18 MED ORDER — ONDANSETRON HCL 4 MG/2ML IJ SOLN
INTRAMUSCULAR | Status: AC
Start: 2016-02-18 — End: 2016-02-18
  Filled 2016-02-18: qty 2

## 2016-02-18 MED ORDER — NON FORMULARY: 40.0000 mg | Freq: Every day | Status: DC

## 2016-02-18 MED ORDER — METOCLOPRAMIDE HCL 5 MG PO TABS: 5.0000 mg | ORAL_TABLET | Freq: Three times a day (TID) | ORAL | Status: DC | PRN

## 2016-02-18 MED ORDER — DEXAMETHASONE SODIUM PHOSPHATE 10 MG/ML IJ SOLN
10.0000 mg | Freq: Once | INTRAMUSCULAR | Status: AC
  Administered 2016-02-18: 10 mg via INTRAVENOUS

## 2016-02-18 MED ORDER — SODIUM CHLORIDE 0.9 % IJ SOLN
INTRAMUSCULAR | Status: DC | PRN
  Administered 2016-02-18: 30 mL

## 2016-02-18 MED ORDER — HYDROMORPHONE HCL 1 MG/ML IJ SOLN: 0.2500 mg | INTRAMUSCULAR | Status: DC | PRN

## 2016-02-18 MED ORDER — SODIUM CHLORIDE 0.9 % IJ SOLN
INTRAMUSCULAR | Status: AC
  Filled 2016-02-18: qty 50

## 2016-02-18 MED ORDER — KETOROLAC TROMETHAMINE 30 MG/ML IJ SOLN
INTRAMUSCULAR | Status: AC
  Filled 2016-02-18: qty 1

## 2016-02-18 MED ORDER — ALBUTEROL SULFATE HFA 108 (90 BASE) MCG/ACT IN AERS
INHALATION_SPRAY | RESPIRATORY_TRACT | Status: DC | PRN
  Administered 2016-02-18: 4 via RESPIRATORY_TRACT
  Administered 2016-02-18: 2 via RESPIRATORY_TRACT

## 2016-02-18 MED ORDER — FERROUS SULFATE 325 (65 FE) MG PO TABS
325.0000 mg | ORAL_TABLET | Freq: Three times a day (TID) | ORAL | Status: DC
  Administered 2016-02-19: 325 mg via ORAL
  Filled 2016-02-18: qty 1

## 2016-02-18 MED ORDER — MEPERIDINE HCL 50 MG/ML IJ SOLN: 6.2500 mg | INTRAMUSCULAR | Status: DC | PRN

## 2016-02-18 MED ORDER — LACTATED RINGERS IV SOLN: INTRAVENOUS | Status: DC

## 2016-02-18 MED ORDER — LIDOCAINE HCL (CARDIAC) 20 MG/ML IV SOLN
INTRAVENOUS | Status: DC | PRN
  Administered 2016-02-18: 100 mg via INTRAVENOUS

## 2016-02-18 MED ORDER — BUPIVACAINE-EPINEPHRINE (PF) 0.25% -1:200000 IJ SOLN
INTRAMUSCULAR | Status: AC
Start: 2016-02-18 — End: 2016-02-18
  Filled 2016-02-18: qty 30

## 2016-02-18 MED ORDER — DOCUSATE SODIUM 100 MG PO CAPS
100.0000 mg | ORAL_CAPSULE | Freq: Two times a day (BID) | ORAL | Status: DC
  Administered 2016-02-18 – 2016-02-19 (×2): 100 mg via ORAL
  Filled 2016-02-18 (×2): qty 1

## 2016-02-18 MED ORDER — EPHEDRINE SULFATE 50 MG/ML IJ SOLN
INTRAMUSCULAR | Status: DC | PRN
  Administered 2016-02-18: 10 mg via INTRAVENOUS
  Administered 2016-02-18 (×2): 5 mg via INTRAVENOUS

## 2016-02-18 MED ORDER — ASPIRIN 81 MG PO CHEW
81.0000 mg | CHEWABLE_TABLET | Freq: Two times a day (BID) | ORAL | Status: DC
  Administered 2016-02-19: 81 mg via ORAL
  Filled 2016-02-18: qty 1

## 2016-02-18 MED ORDER — CHLORHEXIDINE GLUCONATE 4 % EX LIQD
60.0000 mL | Freq: Once | CUTANEOUS | Status: DC
Start: 2016-02-18 — End: 2016-02-18

## 2016-02-18 MED ORDER — ALBUTEROL SULFATE HFA 108 (90 BASE) MCG/ACT IN AERS
INHALATION_SPRAY | RESPIRATORY_TRACT | Status: AC
  Filled 2016-02-18: qty 6.7

## 2016-02-18 MED ORDER — 0.9 % SODIUM CHLORIDE (POUR BTL) OPTIME
TOPICAL | Status: DC | PRN
  Administered 2016-02-18: 1000 mL

## 2016-02-18 MED ORDER — ALBUTEROL SULFATE HFA 108 (90 BASE) MCG/ACT IN AERS: 1.0000 | INHALATION_SPRAY | Freq: Four times a day (QID) | RESPIRATORY_TRACT | Status: DC | PRN

## 2016-02-18 MED ORDER — FENTANYL CITRATE (PF) 250 MCG/5ML IJ SOLN
INTRAMUSCULAR | Status: AC
  Filled 2016-02-18: qty 5

## 2016-02-18 MED ORDER — CEFAZOLIN SODIUM-DEXTROSE 2-4 GM/100ML-% IV SOLN
INTRAVENOUS | Status: AC
  Filled 2016-02-18: qty 100

## 2016-02-18 MED ORDER — CELECOXIB 200 MG PO CAPS
200.0000 mg | ORAL_CAPSULE | Freq: Two times a day (BID) | ORAL | Status: DC
  Administered 2016-02-18 – 2016-02-19 (×2): 200 mg via ORAL
  Filled 2016-02-18 (×2): qty 1

## 2016-02-18 MED ORDER — SERTRALINE HCL 50 MG PO TABS
100.0000 mg | ORAL_TABLET | Freq: Every day | ORAL | Status: DC
  Administered 2016-02-18: 100 mg via ORAL
  Filled 2016-02-18: qty 2

## 2016-02-18 MED ORDER — ONDANSETRON HCL 4 MG/2ML IJ SOLN
INTRAMUSCULAR | Status: DC | PRN
  Administered 2016-02-18: 4 mg via INTRAVENOUS

## 2016-02-18 MED ORDER — DEXAMETHASONE SODIUM PHOSPHATE 10 MG/ML IJ SOLN
10.0000 mg | Freq: Once | INTRAMUSCULAR | Status: AC
  Administered 2016-02-19: 10 mg via INTRAVENOUS
  Filled 2016-02-18: qty 1

## 2016-02-18 MED ORDER — CEFAZOLIN SODIUM-DEXTROSE 2-4 GM/100ML-% IV SOLN
2.0000 g | Freq: Four times a day (QID) | INTRAVENOUS | Status: AC
Start: 2016-02-18 — End: 2016-02-18
  Administered 2016-02-18 (×2): 2 g via INTRAVENOUS
  Filled 2016-02-18 (×2): qty 100

## 2016-02-18 MED ORDER — MAGNESIUM CITRATE PO SOLN: 1.0000 | Freq: Once | ORAL | Status: DC | PRN

## 2016-02-18 MED ORDER — PROPOFOL 10 MG/ML IV BOLUS
INTRAVENOUS | Status: AC
  Filled 2016-02-18: qty 20

## 2016-02-18 MED ORDER — ESOMEPRAZOLE MAGNESIUM 40 MG PO CPDR
40.0000 mg | DELAYED_RELEASE_CAPSULE | Freq: Every day | ORAL | Status: DC
  Administered 2016-02-19: 40 mg via ORAL
  Filled 2016-02-18: qty 1

## 2016-02-18 MED ORDER — LIDOCAINE HCL (CARDIAC) 20 MG/ML IV SOLN
INTRAVENOUS | Status: AC
  Filled 2016-02-18: qty 5

## 2016-02-18 MED ORDER — ROSUVASTATIN CALCIUM 20 MG PO TABS
40.0000 mg | ORAL_TABLET | Freq: Every day | ORAL | Status: DC
  Administered 2016-02-18: 40 mg via ORAL
  Filled 2016-02-18: qty 2

## 2016-02-18 MED ORDER — METHOCARBAMOL 500 MG PO TABS
500.0000 mg | ORAL_TABLET | Freq: Four times a day (QID) | ORAL | Status: DC | PRN
  Administered 2016-02-19: 500 mg via ORAL
  Filled 2016-02-18: qty 1

## 2016-02-18 MED ORDER — KETOROLAC TROMETHAMINE 30 MG/ML IJ SOLN
INTRAMUSCULAR | Status: DC | PRN
  Administered 2016-02-18: 30 mg

## 2016-02-18 MED ORDER — MENTHOL 3 MG MT LOZG: 1.0000 | LOZENGE | OROMUCOSAL | Status: DC | PRN

## 2016-02-18 MED ORDER — POTASSIUM CHLORIDE 2 MEQ/ML IV SOLN
INTRAVENOUS | Status: DC
  Administered 2016-02-18 – 2016-02-19 (×2): via INTRAVENOUS
  Filled 2016-02-18 (×4): qty 1000

## 2016-02-18 MED ORDER — HYDROCODONE-ACETAMINOPHEN 10-325 MG PO TABS
1.0000 | ORAL_TABLET | ORAL | Status: DC
  Administered 2016-02-18 – 2016-02-19 (×6): 1 via ORAL
  Filled 2016-02-18: qty 2
  Filled 2016-02-18: qty 1
  Filled 2016-02-18: qty 2
  Filled 2016-02-18: qty 1
  Filled 2016-02-18: qty 2
  Filled 2016-02-18: qty 1

## 2016-02-18 MED ORDER — MOMETASONE FURO-FORMOTEROL FUM 200-5 MCG/ACT IN AERO
2.0000 | INHALATION_SPRAY | Freq: Two times a day (BID) | RESPIRATORY_TRACT | Status: DC
  Administered 2016-02-18 – 2016-02-19 (×2): 2 via RESPIRATORY_TRACT
  Filled 2016-02-18: qty 8.8

## 2016-02-18 MED ORDER — ONDANSETRON HCL 4 MG/2ML IJ SOLN: 4.0000 mg | Freq: Four times a day (QID) | INTRAMUSCULAR | Status: DC | PRN

## 2016-02-18 MED ORDER — BUPIVACAINE-EPINEPHRINE 0.25% -1:200000 IJ SOLN
INTRAMUSCULAR | Status: DC | PRN
  Administered 2016-02-18: 30 mL

## 2016-02-18 MED ORDER — SODIUM CHLORIDE 0.9 % IR SOLN
Status: DC | PRN
  Administered 2016-02-18 (×2): 1000 mL

## 2016-02-18 MED ORDER — BISACODYL 10 MG RE SUPP: 10.0000 mg | Freq: Every day | RECTAL | Status: DC | PRN

## 2016-02-18 MED ORDER — CEFAZOLIN SODIUM-DEXTROSE 2-4 GM/100ML-% IV SOLN
2.0000 g | INTRAVENOUS | Status: AC
  Administered 2016-02-18: 2 g via INTRAVENOUS
  Filled 2016-02-18: qty 100

## 2016-02-18 MED ORDER — TRANEXAMIC ACID 1000 MG/10ML IV SOLN
1000.0000 mg | INTRAVENOUS | Status: AC
  Administered 2016-02-18: 1000 mg via INTRAVENOUS
  Filled 2016-02-18: qty 10

## 2016-02-18 MED ORDER — PHENOL 1.4 % MT LIQD: 1.0000 | OROMUCOSAL | Status: DC | PRN

## 2016-02-18 MED ORDER — MIDAZOLAM HCL 2 MG/2ML IJ SOLN
INTRAMUSCULAR | Status: AC
  Filled 2016-02-18: qty 2

## 2016-02-18 MED ORDER — POLYETHYLENE GLYCOL 3350 17 G PO PACK
17.0000 g | PACK | Freq: Two times a day (BID) | ORAL | Status: DC
  Administered 2016-02-19: 17 g via ORAL
  Filled 2016-02-18 (×2): qty 1

## 2016-02-18 MED ORDER — METOCLOPRAMIDE HCL 5 MG/ML IJ SOLN: 5.0000 mg | Freq: Three times a day (TID) | INTRAMUSCULAR | Status: DC | PRN

## 2016-02-18 MED ORDER — MIDAZOLAM HCL 5 MG/5ML IJ SOLN
INTRAMUSCULAR | Status: DC | PRN
  Administered 2016-02-18: 1 mg via INTRAVENOUS
  Administered 2016-02-18: 2 mg via INTRAVENOUS

## 2016-02-18 MED ORDER — LEVOTHYROXINE SODIUM 25 MCG PO TABS
25.0000 ug | ORAL_TABLET | Freq: Every day | ORAL | Status: DC
  Administered 2016-02-18: 25 ug via ORAL
  Filled 2016-02-18: qty 1

## 2016-02-18 MED ORDER — ARFORMOTEROL TARTRATE 15 MCG/2ML IN NEBU
15.0000 ug | INHALATION_SOLUTION | Freq: Two times a day (BID) | RESPIRATORY_TRACT | Status: DC
  Administered 2016-02-18 – 2016-02-19 (×2): 15 ug via RESPIRATORY_TRACT
  Filled 2016-02-18 (×3): qty 2

## 2016-02-18 MED ORDER — TRANEXAMIC ACID 1000 MG/10ML IV SOLN
1000.0000 mg | Freq: Once | INTRAVENOUS | Status: AC
  Administered 2016-02-18: 1000 mg via INTRAVENOUS
  Filled 2016-02-18: qty 10

## 2016-02-18 MED ORDER — HYDROMORPHONE HCL 1 MG/ML IJ SOLN: 0.5000 mg | INTRAMUSCULAR | Status: DC | PRN

## 2016-02-18 MED ORDER — ONDANSETRON HCL 4 MG PO TABS: 4.0000 mg | ORAL_TABLET | Freq: Four times a day (QID) | ORAL | Status: DC | PRN

## 2016-02-18 MED ORDER — PROPOFOL 10 MG/ML IV BOLUS
INTRAVENOUS | Status: DC | PRN
  Administered 2016-02-18: 180 mg via INTRAVENOUS
  Administered 2016-02-18: 50 mg via INTRAVENOUS

## 2016-02-18 MED ORDER — FENTANYL CITRATE (PF) 100 MCG/2ML IJ SOLN
INTRAMUSCULAR | Status: DC | PRN
  Administered 2016-02-18: 100 ug via INTRAVENOUS
  Administered 2016-02-18 (×3): 50 ug via INTRAVENOUS
  Administered 2016-02-18: 100 ug via INTRAVENOUS
  Administered 2016-02-18: 50 ug via INTRAVENOUS

## 2016-02-18 MED ORDER — ALBUTEROL SULFATE (2.5 MG/3ML) 0.083% IN NEBU: 2.5000 mg | INHALATION_SOLUTION | Freq: Four times a day (QID) | RESPIRATORY_TRACT | Status: DC | PRN

## 2016-02-18 MED ORDER — DIPHENHYDRAMINE HCL 25 MG PO CAPS: 25.0000 mg | ORAL_CAPSULE | Freq: Four times a day (QID) | ORAL | Status: DC | PRN

## 2016-02-18 MED ORDER — ALUM & MAG HYDROXIDE-SIMETH 200-200-20 MG/5ML PO SUSP: 30.0000 mL | ORAL | Status: DC | PRN

## 2016-02-18 MED ORDER — PROMETHAZINE HCL 25 MG/ML IJ SOLN: 6.2500 mg | INTRAMUSCULAR | Status: DC | PRN

## 2016-02-18 MED ORDER — METHOCARBAMOL 1000 MG/10ML IJ SOLN
500.0000 mg | Freq: Four times a day (QID) | INTRAVENOUS | Status: DC | PRN
  Filled 2016-02-18: qty 5

## 2016-02-18 MED ORDER — LACTATED RINGERS IV SOLN
INTRAVENOUS | Status: DC
Start: 2016-02-18 — End: 2016-02-18
  Administered 2016-02-18 (×3): via INTRAVENOUS

## 2016-02-18 SURGICAL SUPPLY — 75 items
ADAPTER BOLT FEMORAL +2/-2 (Knees) ×2 IMPLANT
ADPR FEM +2/-2 OFST BOLT (Knees) ×1 IMPLANT
ADPR FEM 5D STRL KN PFC SGM (Orthopedic Implant) ×1 IMPLANT
AUG TIB SZ2 10 REV STP WDG (Knees) ×1 IMPLANT
AUGMENT PFC SIG RP SZ2.5 12.5M (Knees) IMPLANT
BAG DECANTER FOR FLEXI CONT (MISCELLANEOUS) IMPLANT
BAG SPEC THK2 15X12 ZIP CLS (MISCELLANEOUS)
BAG ZIPLOCK 12X15 (MISCELLANEOUS) IMPLANT
BANDAGE ACE 6X5 VEL STRL LF (GAUZE/BANDAGES/DRESSINGS) ×3 IMPLANT
BLADE SAW SGTL 13.0X1.19X90.0M (BLADE) ×3 IMPLANT
BLADE SAW SGTL 81X20 HD (BLADE) ×3 IMPLANT
BONE CEMENT GENTAMICIN (Cement) ×9 IMPLANT
BRUSH FEMORAL CANAL (MISCELLANEOUS) ×2 IMPLANT
CEMENT BONE GENTAMICIN 40 (Cement) IMPLANT
CEMENT RESTRICTOR DEPUY SZ 4 (Cement) ×2 IMPLANT
CLOTH BEACON ORANGE TIMEOUT ST (SAFETY) ×3 IMPLANT
CUFF TOURN SGL QUICK 34 (TOURNIQUET CUFF) ×3
CUFF TRNQT CYL 34X4X40X1 (TOURNIQUET CUFF) ×1 IMPLANT
DRAPE U-SHAPE 47X51 STRL (DRAPES) ×3 IMPLANT
DRESSING AQUACEL AG SP 3.5X10 (GAUZE/BANDAGES/DRESSINGS) IMPLANT
DRSG ADAPTIC 3X8 NADH LF (GAUZE/BANDAGES/DRESSINGS) IMPLANT
DRSG AQUACEL AG ADV 3.5X10 (GAUZE/BANDAGES/DRESSINGS) ×3 IMPLANT
DRSG AQUACEL AG SP 3.5X10 (GAUZE/BANDAGES/DRESSINGS)
DRSG PAD ABDOMINAL 8X10 ST (GAUZE/BANDAGES/DRESSINGS) IMPLANT
DRSG TEGADERM 4X4.75 (GAUZE/BANDAGES/DRESSINGS) IMPLANT
DURAPREP 26ML APPLICATOR (WOUND CARE) ×6 IMPLANT
ELECT REM PT RETURN 9FT ADLT (ELECTROSURGICAL) ×3
ELECTRODE REM PT RTRN 9FT ADLT (ELECTROSURGICAL) ×1 IMPLANT
FEMORAL ADAPTER (Orthopedic Implant) ×2 IMPLANT
FEMUR SIGMA PS SZ 2.5 R (Femur) ×2 IMPLANT
GAUZE SPONGE 2X2 8PLY STRL LF (GAUZE/BANDAGES/DRESSINGS) IMPLANT
GAUZE SPONGE 4X4 12PLY STRL (GAUZE/BANDAGES/DRESSINGS) IMPLANT
GLOVE BIOGEL M 7.0 STRL (GLOVE) IMPLANT
GLOVE BIOGEL PI IND STRL 7.5 (GLOVE) ×1 IMPLANT
GLOVE BIOGEL PI IND STRL 8.5 (GLOVE) ×1 IMPLANT
GLOVE BIOGEL PI INDICATOR 7.5 (GLOVE) ×14
GLOVE BIOGEL PI INDICATOR 8.5 (GLOVE) ×2
GLOVE ECLIPSE 8.0 STRL XLNG CF (GLOVE) ×3 IMPLANT
GLOVE ORTHO TXT STRL SZ7.5 (GLOVE) ×6 IMPLANT
GOWN STRL REUS W/TWL LRG LVL3 (GOWN DISPOSABLE) ×5 IMPLANT
GOWN STRL REUS W/TWL XL LVL3 (GOWN DISPOSABLE) ×5 IMPLANT
HANDPIECE INTERPULSE COAX TIP (DISPOSABLE) ×3
IMMOBILIZER KNEE 20 (SOFTGOODS)
IMMOBILIZER KNEE 20 THIGH 36 (SOFTGOODS) IMPLANT
LIQUID BAND (GAUZE/BANDAGES/DRESSINGS) ×3 IMPLANT
MANIFOLD NEPTUNE II (INSTRUMENTS) ×3 IMPLANT
NS IRRIG 1000ML POUR BTL (IV SOLUTION) ×3 IMPLANT
PACK TOTAL KNEE CUSTOM (KITS) ×3 IMPLANT
PADDING CAST COTTON 6X4 STRL (CAST SUPPLIES) IMPLANT
PFC SIGMA RP STB SZ 2.5 12.5M (Knees) ×3 IMPLANT
POSITIONER SURGICAL ARM (MISCELLANEOUS) ×3 IMPLANT
RESTRICTOR CEMENT SZ 5 C-STEM (Cement) ×2 IMPLANT
SET HNDPC FAN SPRY TIP SCT (DISPOSABLE) ×1 IMPLANT
SET PAD KNEE POSITIONER (MISCELLANEOUS) ×3 IMPLANT
SPONGE GAUZE 2X2 STER 10/PKG (GAUZE/BANDAGES/DRESSINGS)
SPONGE LAP 18X18 X RAY DECT (DISPOSABLE) ×2 IMPLANT
STAPLER VISISTAT 35W (STAPLE) IMPLANT
STEM TIBIA PFC 13X30MM (Stem) ×4 IMPLANT
SUT MNCRL AB 3-0 PS2 18 (SUTURE) ×3 IMPLANT
SUT VIC AB 1 CT1 36 (SUTURE) ×3 IMPLANT
SUT VIC AB 2-0 CT1 27 (SUTURE) ×9
SUT VIC AB 2-0 CT1 TAPERPNT 27 (SUTURE) ×3 IMPLANT
SUT VLOC 180 0 24IN GS25 (SUTURE) ×3 IMPLANT
SYR 50ML LL SCALE MARK (SYRINGE) ×3 IMPLANT
TOWER CARTRIDGE SMART MIX (DISPOSABLE) ×3 IMPLANT
TRAY FOLEY W/METER SILVER 14FR (SET/KITS/TRAYS/PACK) ×3 IMPLANT
TRAY FOLEY W/METER SILVER 16FR (SET/KITS/TRAYS/PACK) ×1 IMPLANT
TRAY SLEEVE CEM ML (Knees) ×4 IMPLANT
TRAY TIB SZ 2 REVISION (Knees) ×2 IMPLANT
TUBE KAMVAC SUCTION (TUBING) IMPLANT
WATER STERILE IRR 1500ML POUR (IV SOLUTION) ×3 IMPLANT
WEDGE STEP 1.5X10MM JOINT (Orthopedic Implant) ×2 IMPLANT
WEDGE SZ 2.0MM (Knees) ×2 IMPLANT
WRAP KNEE MAXI GEL POST OP (GAUZE/BANDAGES/DRESSINGS) ×3 IMPLANT
YANKAUER SUCT BULB TIP 10FT TU (MISCELLANEOUS) IMPLANT

## 2016-02-18 NOTE — Anesthesia Postprocedure Evaluation (Signed)
Anesthesia Post Note  Patient: Jessica Lam  Procedure(s) Performed: Procedure(s) (LRB): RIGHT TOTAL KNEE REVISION WITH SAPHENOUS NERVE EXCISION (Right)  Patient location during evaluation: PACU Anesthesia Type: General Level of consciousness: awake and alert Pain management: pain level controlled Vital Signs Assessment: post-procedure vital signs reviewed and stable Respiratory status: spontaneous breathing, nonlabored ventilation, respiratory function stable and patient connected to nasal cannula oxygen Cardiovascular status: blood pressure returned to baseline and stable Postop Assessment: no signs of nausea or vomiting Anesthetic complications: no    Last Vitals:  Vitals:   02/18/16 1435 02/18/16 1445  BP: (!) 142/81 (!) 142/81  Pulse: 74 74  Resp: 16 16  Temp: 37 C 37 C    Last Pain:  Vitals:   02/18/16 1445  TempSrc: Oral  PainSc:                  Shelton SilvasKevin D Hollis

## 2016-02-18 NOTE — Transfer of Care (Signed)
Immediate Anesthesia Transfer of Care Note  Patient: Jessica Lam  Procedure(s) Performed: Procedure(s): RIGHT TOTAL KNEE REVISION WITH SAPHENOUS NERVE EXCISION (Right)  Patient Location: PACU  Anesthesia Type:General  Level of Consciousness:  sedated, patient cooperative and responds to stimulation  Airway & Oxygen Therapy:Patient Spontanous Breathing and Patient connected to face mask oxgen  Post-op Assessment:  Report given to PACU RN and Post -op Vital signs reviewed and stable  Post vital signs:  Reviewed and stable  Last Vitals:  Vitals:   02/18/16 0806  BP: (!) 171/88  Pulse: 79  Resp: 18  Temp: 36.6 C    Complications: No apparent anesthesia complications

## 2016-02-18 NOTE — Interval H&P Note (Signed)
History and Physical Interval Note:  02/18/2016 8:59 AM  Jessica Lam  has presented today for surgery, with the diagnosis of RIGHT FAILED TOTAL KNEE  The various methods of treatment have been discussed with the patient and family. After consideration of risks, benefits and other options for treatment, the patient has consented to  Procedure(s): RIGHT TOTAL KNEE REVISION WITH SAPHENOUS NERVE EXCISION (Right) as a surgical intervention .  The patient's history has been reviewed, patient examined, no change in status, stable for surgery.  I have reviewed the patient's chart and labs.  Questions were answered to the patient's satisfaction.     Shelda PalLIN,Torris House D

## 2016-02-18 NOTE — Anesthesia Procedure Notes (Signed)
Procedure Name: LMA Insertion Date/Time: 02/18/2016 10:39 AM Performed by: Epimenio SarinJARVELA, Jessica Lam Pre-anesthesia Checklist: Patient identified, Emergency Drugs available, Suction available, Patient being monitored and Timeout performed Patient Re-evaluated:Patient Re-evaluated prior to inductionOxygen Delivery Method: Circle system utilized Preoxygenation: Pre-oxygenation with 100% oxygen Intubation Type: IV induction Ventilation: Mask ventilation without difficulty LMA: LMA with gastric port inserted LMA Size: 3.0 Number of attempts: 1 Dental Injury: Teeth and Oropharynx as per pre-operative assessment

## 2016-02-18 NOTE — Anesthesia Preprocedure Evaluation (Addendum)
Anesthesia Evaluation  Patient identified by MRN, date of birth, ID band Patient awake    Reviewed: Allergy & Precautions, NPO status , Patient's Chart, lab work & pertinent test results  History of Anesthesia Complications (+) PONV and history of anesthetic complications  Airway Mallampati: I  TM Distance: >3 FB Neck ROM: Full    Dental  (+) Edentulous Upper, Edentulous Lower   Pulmonary sleep apnea , COPD,  COPD inhaler, Current Smoker,     + wheezing      Cardiovascular hypertension,  Rhythm:Regular Rate:Normal     Neuro/Psych PSYCHIATRIC DISORDERS Depression negative neurological ROS     GI/Hepatic Neg liver ROS, GERD  Medicated,  Endo/Other  Hypothyroidism   Renal/GU negative Renal ROS  negative genitourinary   Musculoskeletal  (+) Arthritis ,   Abdominal   Peds negative pediatric ROS (+)  Hematology negative hematology ROS (+)   Anesthesia Other Findings   Reproductive/Obstetrics negative OB ROS                           Anesthesia Physical Anesthesia Plan  ASA: III  Anesthesia Plan: General   Post-op Pain Management:    Induction: Intravenous  Airway Management Planned: LMA  Additional Equipment:   Intra-op Plan:   Post-operative Plan: Extubation in OR  Informed Consent: I have reviewed the patients History and Physical, chart, labs and discussed the procedure including the risks, benefits and alternatives for the proposed anesthesia with the patient or authorized representative who has indicated his/her understanding and acceptance.   Dental advisory given  Plan Discussed with: CRNA  Anesthesia Plan Comments: (Pt refused spinal and nerve block.  Edentulous will placed OETT if necessary. Slight wheeze, will administer albuterol in preop. )       Anesthesia Quick Evaluation

## 2016-02-18 NOTE — Evaluation (Signed)
Physical Therapy Evaluation Patient Details Name: Jessica Lam MRN: [id number] DOB: 1956-02-19 Today's Date: 02/18/2016   History of Present Illness  R TKA revision  Clinical Impression  Pt is s/p TKA resulting in the deficits listed below (see PT Problem List). Pt ambulated 8540' with RW, initiated TKA exercises. Good progress expected.  Pt will benefit from skilled PT to increase their independence and safety with mobility to allow discharge to the venue listed below.      Follow Up Recommendations Home health PT    Equipment Recommendations  Rolling walker with 5" wheels;3in1 (PT)    Recommendations for Other Services       Precautions / Restrictions Precautions Precautions: Knee Restrictions Weight Bearing Restrictions: No Other Position/Activity Restrictions: WBAT      Mobility  Bed Mobility Overal bed mobility: Needs Assistance Bed Mobility: Supine to Sit     Supine to sit: Min assist     General bed mobility comments: min A RLE  Transfers Overall transfer level: Needs assistance Equipment used: Rolling walker (2 wheeled) Transfers: Sit to/from Stand Sit to Stand: Min assist         General transfer comment: min A to rise/steady, VCs hand placement  Ambulation/Gait Ambulation/Gait assistance: Min guard Ambulation Distance (Feet): 40 Feet Assistive device: Rolling walker (2 wheeled) Gait Pattern/deviations: Step-to pattern   Gait velocity interpretation: Below normal speed for age/gender General Gait Details: VCs sequencing, steady with RW, no LOB  Stairs            Wheelchair Mobility    Modified Rankin (Stroke Patients Only)       Balance Overall balance assessment: Modified Independent                                           Pertinent Vitals/Pain Pain Assessment: 0-10 Pain Score: 4  Pain Location: R knee Pain Intervention(s): Monitored during session;Limited activity within patient's tolerance;Ice applied     Home Living Family/patient expects to be discharged to:: Private residence Living Arrangements: Other relatives (sister) Available Help at Discharge: Family;Available 24 hours/day   Home Access: Stairs to enter Entrance Stairs-Rails: Can reach both;Left;Right Entrance Stairs-Number of Steps: 5 Home Layout: Two level Home Equipment: Cane - single point      Prior Function Level of Independence: Independent with assistive device(s)         Comments: used cane prn     Hand Dominance        Extremity/Trunk Assessment   Upper Extremity Assessment: Overall WFL for tasks assessed           Lower Extremity Assessment: RLE deficits/detail RLE Deficits / Details: knee ext 3/5, SLR +2/5, ankle WNL    Cervical / Trunk Assessment: Normal  Communication   Communication: No difficulties  Cognition Arousal/Alertness: Awake/alert Behavior During Therapy: WFL for tasks assessed/performed Overall Cognitive Status: Within Functional Limits for tasks assessed                      General Comments      Exercises Total Joint Exercises Ankle Circles/Pumps: AROM;Both;10 reps Quad Sets: AROM;Both;5 reps;Supine Heel Slides: AAROM;Right;10 reps;Supine Long Arc Quad: AROM;Right;5 reps;Seated Goniometric ROM: 5-45*      Assessment/Plan    PT Assessment Patient needs continued PT services  PT Diagnosis Difficulty walking;Acute pain   PT Problem List Decreased strength;Decreased mobility;Pain  PT Treatment Interventions  DME instruction;Gait training;Functional mobility training;Therapeutic activities;Therapeutic exercise;Patient/family education   PT Goals (Current goals can be found in the Care Plan section) Acute Rehab PT Goals Patient Stated Goal: to go fishing PT Goal Formulation: With patient Time For Goal Achievement: 02/25/16 Potential to Achieve Goals: Good    Frequency 7X/week   Barriers to discharge        Co-evaluation               End  of Session Equipment Utilized During Treatment: Gait belt Activity Tolerance: Patient tolerated treatment well Patient left: in chair;with call bell/phone within reach;with chair alarm set;with family/visitor present Nurse Communication: Mobility status         Time: 6295-2841 PT Time Calculation (min) (ACUTE ONLY): 18 min   Charges:   PT Evaluation $PT Eval Low Complexity: 1 Procedure     PT G CodesTamala Ser 02/18/2016, 4:27 PM [phone]

## 2016-02-19 LAB — BASIC METABOLIC PANEL
ANION GAP: 8 (ref 5–15)
BUN: 10 mg/dL (ref 6–20)
CALCIUM: 8.4 mg/dL — AB (ref 8.9–10.3)
CO2: 27 mmol/L (ref 22–32)
CREATININE: 0.71 mg/dL (ref 0.44–1.00)
Chloride: 104 mmol/L (ref 101–111)
Glucose, Bld: 137 mg/dL — ABNORMAL HIGH (ref 65–99)
Potassium: 4.3 mmol/L (ref 3.5–5.1)
SODIUM: 139 mmol/L (ref 135–145)

## 2016-02-19 LAB — CBC
HCT: 35.2 % — ABNORMAL LOW (ref 36.0–46.0)
Hemoglobin: 11.8 g/dL — ABNORMAL LOW (ref 12.0–15.0)
MCH: 31.1 pg (ref 26.0–34.0)
MCHC: 33.5 g/dL (ref 30.0–36.0)
MCV: 92.9 fL (ref 78.0–100.0)
Platelets: 192 10*3/uL (ref 150–400)
RBC: 3.79 MIL/uL — ABNORMAL LOW (ref 3.87–5.11)
RDW: 13.1 % (ref 11.5–15.5)
WBC: 12.5 10*3/uL — AB (ref 4.0–10.5)

## 2016-02-19 MED ORDER — ASPIRIN 81 MG PO CHEW
81.0000 mg | CHEWABLE_TABLET | Freq: Two times a day (BID) | ORAL | 0 refills | Status: DC
Start: 2016-02-19 — End: 2016-08-18

## 2016-02-19 MED ORDER — POLYETHYLENE GLYCOL 3350 17 G PO PACK: 17.0000 g | PACK | Freq: Two times a day (BID) | ORAL | 0 refills | Status: DC

## 2016-02-19 MED ORDER — DOCUSATE SODIUM 100 MG PO CAPS: 100.0000 mg | ORAL_CAPSULE | Freq: Two times a day (BID) | ORAL | 0 refills | Status: DC

## 2016-02-19 MED ORDER — CYCLOBENZAPRINE HCL 10 MG PO TABS: 10.0000 mg | ORAL_TABLET | Freq: Three times a day (TID) | ORAL | 0 refills | Status: DC | PRN

## 2016-02-19 MED ORDER — HYDROCODONE-ACETAMINOPHEN 10-325 MG PO TABS: 1.0000 | ORAL_TABLET | ORAL | 0 refills | Status: DC | PRN

## 2016-02-19 MED ORDER — FERROUS SULFATE 325 (65 FE) MG PO TABS: 325.0000 mg | ORAL_TABLET | Freq: Three times a day (TID) | ORAL | 3 refills | Status: DC

## 2016-02-19 NOTE — Brief Op Note (Signed)
02/18/2016  9:25 AM  PATIENT:  Jessica Lam  60 y.o. female  PRE-OPERATIVE DIAGNOSIS:  RIGHT FAILED TOTAL KNEE replacement  POST-OPERATIVE DIAGNOSIS:  RIGHT FAILED TOTAL KNEE replacement  PROCEDURE:  Procedure(s): RIGHT TOTAL KNEE REVISION   SURGEON:  Surgeon(s) and Role:    * Durene RomansMatthew Ersa Delaney, MD - Primary  PHYSICIAN ASSISTANT: Lanney GinsMatthew Babish, PA-C  ANESTHESIA:   general  EBL:  Total I/O In: 480 [P.O.:480] Out: 300 [Urine:300]  BLOOD ADMINISTERED:none  DRAINS: none   LOCAL MEDICATIONS USED:  MARCAINE     SPECIMEN:  No Specimen  DISPOSITION OF SPECIMEN:  N/A  COUNTS:  YES  TOURNIQUET:   Total Tourniquet Time Documented: Thigh (Right) - 65 minutes Total: Thigh (Right) - 65 minutes   DICTATION: .Other Dictation: Dictation Number [id number]  PLAN OF CARE: Admit to inpatient   PATIENT DISPOSITION:  PACU - hemodynamically stable.   Delay start of Pharmacological VTE agent (>24hrs) due to surgical blood loss or risk of bleeding: no

## 2016-02-19 NOTE — Care Management Note (Signed)
Case Management Note  Patient Details  Name: Jessica Lam MRN: [id number] Date of Birth: 09-Aug-1955  Subjective/Objective:                  RIGHT TOTAL KNEE REVISION WITH SAPHENOUS NERVE EXCISION (Right) Action/Plan: Discharge planning Expected Discharge Date:                  Expected Discharge Plan:  [place]  In-House Referral:     Discharge planning Services  CM Consult  Post Acute Care Choice:  Home Health Choice offered to:  Patient  DME Arranged:  Walker rolling with seat, Tub bench DME Agency:  [place]:  PT [place] Agency:  [facility] ([facility])  Status of Service:  Completed, signed off  If discussed at [employer], dates discussed:    Additional Comments: CM met with pt to offer choice of home health agency.  Pt chooses [facility] to render HHPT.  Referral given to [facility] rep, Tim.  CM called [facility] DME rep, Jermaine to please delive rthe tub transfer bench and rolllator to room prior to discharge today.  No other CM needs were communicated. Dellie Catholic, RN 02/19/2016, 10:42 AM

## 2016-02-19 NOTE — Progress Notes (Signed)
Physical Therapy Treatment Patient Details Name: Jessica Lam MRN: [id number] DOB: 1956/06/28 Today's Date: 02/19/2016    History of Present Illness R TKA revision    PT Comments    POD # 1 Assisted with amb in hallway, practiced stairs (4 steps B rails and one step with walker), given handout HEP and instructed on use of ICE.  Pt ready for D/C to home.    Follow Up Recommendations  Home health PT     Equipment Recommendations  Rolling walker with 5" wheels;3in1 (PT)    Recommendations for Other Services       Precautions / Restrictions Precautions Precautions: Knee Restrictions Weight Bearing Restrictions: No Other Position/Activity Restrictions: WBAT    Mobility  Bed Mobility Overal bed mobility: Modified Independent             General bed mobility comments: increased time  Transfers Overall transfer level: Needs assistance Equipment used: Rolling walker (2 wheeled) Transfers: Sit to/from BJ'sStand;Stand Pivot Transfers Sit to Stand: Modified independent (Device/Increase time)         General transfer comment: good use of B UE's to steady self and proper turn completion  Ambulation/Gait Ambulation/Gait assistance: Modified independent (Device/Increase time) Ambulation Distance (Feet): 52 Feet Assistive device: Rolling walker (2 wheeled) Gait Pattern/deviations: Step-through pattern;Decreased stance time - right Gait velocity: WFL   General Gait Details: one VC on safety with turns using walker throughout completion   Stairs Stairs: Yes Stairs assistance: Min guard Stair Management: Two rails;No rails Number of Stairs: 5 General stair comments: 4 steps forward usining B rails and one step forward using walker with < 25% VC's on proper tech and safety  Wheelchair Mobility    Modified Rankin (Stroke Patients Only)       Balance                                    Cognition Arousal/Alertness: Awake/alert Behavior During Therapy:  WFL for tasks assessed/performed Overall Cognitive Status: Within Functional Limits for tasks assessed                      Exercises      General Comments        Pertinent Vitals/Pain Pain Assessment: 0-10 Pain Score: 2  Pain Location: R knee Pain Descriptors / Indicators: Aching;Tender Pain Intervention(s): Monitored during session;Ice applied;Repositioned    Home Living Family/patient expects to be discharged to:: Private residence Living Arrangements: Other relatives Available Help at Discharge: Family;Available 24 hours/day   Home Access: Stairs to enter Entrance Stairs-Rails: Can reach both;Left;Right Home Layout: Two level Home Equipment: Cane - single point      Prior Function Level of Independence: Independent with assistive device(s)      Comments: used cane prn   PT Goals (current goals can now be found in the care plan section) Progress towards PT goals: Progressing toward goals    Frequency  7X/week    PT Plan Current plan remains appropriate    Co-evaluation             End of Session Equipment Utilized During Treatment: Gait belt Activity Tolerance: Patient tolerated treatment well Patient left: in chair;with call bell/phone within reach;with chair alarm set;with family/visitor present     Time: 4098-11911022-1046 PT Time Calculation (min) (ACUTE ONLY): 24 min  Charges:  $Gait Training: 8-22 mins $Therapeutic Activity: 8-22 mins  G Codes:      Rica Koyanagi  PTA [facility]  Acute  Rehab Pager      [phone]

## 2016-02-19 NOTE — Discharge Summary (Signed)
Physician Discharge Summary  Patient ID: Jessica Lam MRN: [id number] DOB/AGE: 58-Aug-1957 58 y.o.  Admit date: 02/18/2016 Discharge date: 02/19/2016   Procedures:  Procedure(s) (LRB): RIGHT TOTAL KNEE REVISION WITH SAPHENOUS NERVE EXCISION (Right)  Attending Physician:  Dr. Durene Romans   Admission Diagnoses:   Right knee pain s/p TKA  Discharge Diagnoses:  Principal Problem:   S/P right TKA rev Active Problems:   S/P revision of total knee  Past Medical History:  Diagnosis Date  . Arthritis    DDD -back and srthritis right knee  . COPD (chronic obstructive pulmonary disease) (HCC)   . Depression   . Gas    lots of gas and bloating issues  . Hypertension    med was discontinued over a yr-"Blood pressure too low"  . PONV (postoperative nausea and vomiting)   . Sleep apnea    no cpap used    HPI:    Jessica Lam, 60 y.o. female, has a history of pain and functional disability in the right knee(s) due to failed previous arthroplasty and patient has failed non-surgical conservative treatments for greater than 12 weeks to include NSAID's and/or analgesics, use of assistive devices and activity modification. The indications for the revision of the total knee arthroplasty are loosening of one or more components. Onset of symptoms was gradual starting months ago with rapidlly worsening course since that time.  Prior procedures on the right knee(s) include arthroplasty. Patient currently rates pain in the right knee(s) at 6 out of 10 with activity. There is worsening of pain with activity and weight bearing, pain that interferes with activities of daily living, pain with passive range of motion, crepitus and joint swelling.  Patient has evidence of prosthetic loosening by imaging studies. This condition presents safety issues increasing the risk of falls.  There is no current active infection.  Risks, benefits and expectations were discussed with the patient.  Risks including but not  limited to the risk of anesthesia, blood clots, nerve damage, blood vessel damage, failure of the prosthesis, infection and up to and including death.  Patient understand the risks, benefits and expectations and wishes to proceed with surgery.   PCP: Acey Lav, MD   Discharged Condition: good  Hospital Course:  Patient underwent the above stated procedure on 02/18/2016. Patient tolerated the procedure well and brought to the recovery room in good condition and subsequently to the floor.  POD #1 BP: 149/71 ; Pulse: 63 ; Temp: 97.9 F (36.6 C) ; Resp: 18 Patient reports pain as mild to moderate, appears to be under good control as moving knee well already.  No events noted. Neurovascular intact and incision: dressing C/D/I  LABS  Basename    HGB     11.8  HCT     35.2    Discharge Exam: General appearance: alert, cooperative and no distress Extremities: Homans sign is negative, no sign of DVT, no edema, redness or tenderness in the calves or thighs and no ulcers, gangrene or trophic changes  Disposition: Home with follow up in 2 weeks   Follow-up Information    NEDEFF,DAVID, MD .   Specialty:  Orthopedic Surgery Contact information: [address] [place] [address] [phone]        [facility] .   Why:  Now known as [facility] will provide your physical therapy service Contact information: [address] [place] [address] [phone]  Inc[facility] - Dme [facility] .   Why:  rollator and tub transfer bench Contact information: [address] [place] [address] [phone]        Shelda PalLIN,Diamonte Stavely D, MD. Schedule an appointment as soon as possible for a visit in 2 week(s).   Specialty:  Orthopedic Surgery Contact information: [address] [place] [address] [phone]           Discharge Instructions    Call MD / Call 911    Complete by:  As directed   If you  experience chest pain or shortness of breath, CALL 911 and be transported to the hospital emergency room.  If you develope a fever above 101 F, pus (white drainage) or increased drainage or redness at the wound, or calf pain, call your surgeon's office.   Change dressing    Complete by:  As directed   Maintain surgical dressing until follow up in the clinic. If the edges start to pull up, may reinforce with tape. If the dressing is no longer working, may remove and cover with gauze and tape, but must keep the area dry and clean.  Call with any questions or concerns.   Constipation Prevention    Complete by:  As directed   Drink plenty of fluids.  Prune juice may be helpful.  You may use a stool softener, such as Colace (over the counter) 100 mg twice a day.  Use MiraLax (over the counter) for constipation as needed.   Diet - low sodium heart healthy    Complete by:  As directed   Discharge instructions    Complete by:  As directed   Maintain surgical dressing until follow up in the clinic. If the edges start to pull up, may reinforce with tape. If the dressing is no longer working, may remove and cover with gauze and tape, but must keep the area dry and clean.  [facility]. Call with any questions or concerns.   Increase activity slowly as tolerated    Complete by:  As directed   Weight bearing as tolerated with assist device (walker, cane, etc) as directed, use it as long as suggested by your surgeon or therapist, typically at least 4-6 weeks.   TED hose    Complete by:  As directed   Use stockings (TED hose) for 2 weeks on both leg(s).  You may remove them at night for sleeping.        Medication List    STOP taking these medications   aspirin 325 MG tablet Replaced by:  aspirin 81 MG chewable tablet     TAKE these medications   ADVAIR DISKUS 250-50 MCG/DOSE Aepb Generic drug:  Fluticasone-Salmeterol Take 1 puff by mouth daily as needed (for shortness  of breath).   albuterol 108 (90 Base) MCG/ACT inhaler Commonly known as:  PROVENTIL HFA;VENTOLIN HFA Inhale 1-2 puffs into the lungs every 6 (six) hours as needed for wheezing or shortness of breath.   albuterol (2.5 MG/3ML) 0.083% nebulizer solution Commonly known as:  PROVENTIL Take 2.5 mg by nebulization every 6 (six) hours as needed for wheezing or shortness of breath.   aspirin 81 MG chewable tablet Chew 1 tablet (81 mg total) by mouth 2 (two) times daily. Take for 4 weeks, then resume regular dose. Replaces:  aspirin 325 MG tablet   cyclobenzaprine 10 MG tablet Commonly known as:  FLEXERIL Take 1 tablet (10 mg total) by  mouth 3 (three) times daily as needed for muscle spasms.   docusate sodium 100 MG capsule Commonly known as:  COLACE Take 1 capsule (100 mg total) by mouth 2 (two) times daily.   esomeprazole 40 MG capsule Commonly known as:  NEXIUM Take 40 mg by mouth at bedtime.   ferrous sulfate 325 (65 FE) MG tablet Take 1 tablet (325 mg total) by mouth 3 (three) times daily after meals.   HYDROcodone-acetaminophen 10-325 MG tablet Commonly known as:  NORCO Take 1-2 tablets by mouth every 4 (four) hours as needed. What changed:  how much to take  when to take this  reasons to take this   levothyroxine 25 MCG tablet Commonly known as:  SYNTHROID, LEVOTHROID Take 25 mcg by mouth at bedtime.   PERFOROMIST 20 MCG/2ML nebulizer solution Generic drug:  formoterol Take 20 mcg by nebulization daily as needed (for shortness of breath).   polyethylene glycol packet Commonly known as:  MIRALAX / GLYCOLAX Take 17 g by mouth 2 (two) times daily.   rosuvastatin 40 MG tablet Commonly known as:  CRESTOR Take 40 mg by mouth at bedtime.   sertraline 100 MG tablet Commonly known as:  ZOLOFT Take 100 mg by mouth at bedtime.   Vitamin D (Ergocalciferol) 50000 units Caps capsule Commonly known as:  DRISDOL Take 50,000 Units by mouth every Tuesday.         Signed: Anastasio Auerbach. Sparrow Sanzo   PA-C  02/19/2016, 3:48 PM

## 2016-02-19 NOTE — Progress Notes (Addendum)
Patient ID: Jessica Lam, female   DOB: Dec 11, 1955, 60 y.o.   MRN: [id number] Subjective: 1 Day Post-Op Procedure(s) (LRB): RIGHT TOTAL KNEE REVISION WITH SAPHENOUS NERVE EXCISION (Right)    Patient reports pain as mild to moderate, appears to be under good control as moving knee well already.  No events noted  Objective:   VITALS:   Vitals:   02/19/16 0100 02/19/16 0502  BP: (!) 147/68 (!) 149/71  Pulse: 65 63  Resp: 16 18  Temp: 98.2 F (36.8 C) 97.9 F (36.6 C)    Neurovascular intact Incision: dressing C/D/I  LABS  Recent Labs  02/19/16 0518  HGB 11.8*  HCT 35.2*  WBC 12.5*  PLT 192     Recent Labs  02/19/16 0518  NA 139  K 4.3  BUN 10  CREATININE 0.71  GLUCOSE 137*    No results for input(s): LABPT, INR in the last 72 hours.   Assessment/Plan: 1 Day Post-Op Procedure(s) (LRB): RIGHT TOTAL KNEE REVISION WITH SAPHENOUS NERVE EXCISION (Right)   Advance diet Up with therapy Discharge home with home health after therapy  Reviewed findings Reviewed goals   Obese (BMI 30-39.9) Estimated body mass index is 38.23 kg/m as calculated from the following:   Height as of this encounter: 5\' 2"  (1.575 m).   Weight as of this encounter: 94.8 kg (209 lb). Patient also counseled that weight may inhibit the healing process Patient counseled that losing weight will help with future health issues

## 2016-02-19 NOTE — Discharge Instructions (Signed)

## 2016-02-19 NOTE — Op Note (Signed)
NAMErnie Lam:  Torbeck, Angelis                  ACCOUNT NO.:  [id number]  MEDICAL RECORD NO.:  [id number]  LOCATION:  1610                         [facility]  PHYSICIAN:  Madlyn FrankelMatthew D. Charlann Boxerlin, M.D.  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  PROCEDURE:  Revision right total knee arthroplasty.  COMPONENTS USED:  DePuy revision knee system with a size 2-1/2 posterior stabilized femur with a +2 bolt, 5 degree adapter, 13 x 30 cemented stem.  The tibia size was a size 2, MBT revision tray with 10 mm medial and lateral augments.  On the medial side, I used a size 1-1/2 augment.  We used a 29 cemented sleeve on the tibia side as well.  I used a 12.5 posterior stabilized insert and did not revise the patella.  SURGEON:  Madlyn FrankelMatthew D. Charlann Boxerlin, M.D.  ASSISTANT:  Lanney GinsMatthew Babish, PA-C.  Note that Mr. Jessica SailsBabish was present for the entirety of the case from preoperative positioning, perioperative management of the operative extremity, general facilitation of the case, and primary wound closure.  ANESTHESIA:  General.  COMPLICATIONS:  None.  DRAINS:  None.  BLOOD LOSS:  Less than 100 mL once the tourniquet was let down.  INDICATIONS FOR PROCEDURE:  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  She had presented recently with increasing pain.  She had problems with range of motion in the postoperative period requiring  manipulation.  Radiographic and lab workup revealed no signs of infection.  Bone scan revealed increased uptake in both the tibial femoral components.  Based on these findings, her exam, as well as her lab work, there is not any clinical concern for infection at this point.  However, she was having persistent mechanical-related pain.  She wished at this point to proceed with more definitive measures.  We discussed the risk of infection, DVT, component failure, need for future revision surgeries, particularly in the setting of early revision surgery.  We discussed and stressed again the importance of her physical therapy and range of motion efforts.  PROCEDURE IN DETAIL:  The patient was brought to the operative theater. Once adequate anesthesia, preoperative antibiotics, Ancef administered, she was positioned supine with a right thigh tourniquet placed.  The right lower extremity was then prepped and draped in sterile fashion and [facility].  Time-out was performed identifying the patient, planned procedure, and extremity.  The right lower extremity was exsanguinated, tourniquet elevated to 250 mmHg.  Her old incision was excised, soft tissue plane was created medially and laterally.  A median arthrotomy was then made encountering clear synovial fluid.  Initial exposure was carried out in  the medial and lateral aspects of the joint recreating the gutters and suprapatellar pouch.  This allowed for patella subluxation.  The knee was then flexed and using sharp osteotomes, the tibial tray and polyethylene insert were removed.  At this point, using a thin ACL saw, the bone cement interface was undermined on both the tibial and femoral components.  The femoral component was removed without significant bone loss as was the tibial component.  At this point, remaining cement was removed off the proximal tibia.  I then opened up both canals and hand reamed.   On the tibia side, I was only able to ream to about 13 mm, but on the femoral side I was able to get 15 mm reamer in.  Once the reaming was carried out, I used an extramedullary guide and revisited the proximal tibial cut recreating 2 degrees of posterior slope for the MBT revision tray.  We then sized the cut surface of the tibia to be a size 2, confirmed it was perpendicular in the coronal plane.  I then drilled and keel punched this and then broached for 29 cemented sleeve.  At this point, I placed the initial trial tibial tray and placed a 5 mm augment, which would later be changed based on our trial reduction.  At this point, attention was directed to the femur.  This femoral cut was rechecked, no bone was needed to be resected.  I then revisited the cuts from downsize from a size 3 to a 2-1/2 femoral component.  The anterior, posterior, and chamfer cuts were then revisited using +2 adapter to prevent overstuffing anteriorly.  I then revisited the box cut.  At this point, we did a trial reduction with a standard 2-1/2 femur and the tibial tray already in place.  With this, even with a 15 mm augment, there was slight bit of hyperextension.  This matched the flexion gap.  Given these findings, I removed the trial components.  We elected to use the components noted above.  No augments were used on the distal femur based on the extension/flexion assessment. I did select to use 13 x 30 stem that provided further support due to this early loosening.  At this point, the final components were opened and configured on the back table.  The final cement was mixed.  We then placed cement restrictors in the distal femur, proximal tibia.  The knee was irrigated with normal saline solution.  At this point, the final components were cemented into place.  The knee was brought to extension with a 12.5 insert.  The knee came to full extension, had excellent balance in flexion.  Once the cement  had fully cured, excessive cement was removed throughout the knee.  The final 12.5 posterior stabilized insert was then placed into the tibial tray and the knee reduced.  We irrigated the knee throughout the case again at this point.  At this point, the extensor mechanism was reapproximated using #1 Vicryl and 0 V-Loc sutures.  The remainder of the wound was closed with 2-0 Vicryl and a running 3-0 Monocryl.  The knee was then cleaned, dried, and dressed sterilely using surgical glue and Aquacel dressing.  She tolerated the procedure well.  Findings were reviewed with family.  Plan was for her to stay in the hospital 1 to 2 days.  Weightbearing as tolerated.  Range of motion initiated.     Madlyn Frankel Charlann Boxer, M.D.     MDO/MEDQ  D:  02/19/2016  T:  02/19/2016  Job:  [id number]

## 2016-02-19 NOTE — Progress Notes (Signed)
Occupational Therapy Evaluation Patient Details Name: Jessica Lam MRN: [id number] DOB: 1955-09-27 Today's Date: 02/19/2016    History of Present Illness R TKA revision   Clinical Impression   All OT education completed and pt questions answered. No further OT needs at this time. Will sign off.    Follow Up Recommendations  No OT follow up;Supervision/Assistance - 24 hour    Equipment Recommendations  Tub/shower bench    Recommendations for Other Services       Precautions / Restrictions Precautions Precautions: Knee Restrictions Weight Bearing Restrictions: No Other Position/Activity Restrictions: WBAT      Mobility Bed Mobility                Transfers Overall transfer level: Needs assistance Equipment used: Rolling walker (2 wheeled) Transfers: Sit to/from Stand;Stand Pivot Transfers Sit to Stand: Modified independent (Device/Increase time)         General transfer comment: good use of B UE's to steady self and proper turn completion    Balance                                            ADL Overall ADL's : Needs assistance/impaired Eating/Feeding: Independent   Grooming: Set up;Sitting           Upper Body Dressing : Set up;Sitting   Lower Body Dressing: Minimal assistance;Sit to/from stand   Toilet Transfer: Supervision/safety;Ambulation;RW   Toileting- Clothing Manipulation and Hygiene: Supervision/safety;Sit to/from stand   Tub/ Engineer, structuralhower Transfer: Tub Metallurgisttransfer Tub/Shower Transfer Details (indicate cue type and reason): verbal education of transfer technique with tub bench Functional mobility during ADLs: Supervision/safety;Rolling walker       Vision     Perception     Praxis      Pertinent Vitals/Pain Pain Assessment: 0-10 Pain Score: 2  Pain Location: R knee Pain Descriptors / Indicators: Aching;Tender Pain Intervention(s): Monitored during session;Ice applied;Repositioned     Hand Dominance      Extremity/Trunk Assessment Upper Extremity Assessment Upper Extremity Assessment: Overall WFL for tasks assessed   Lower Extremity Assessment Lower Extremity Assessment: Defer to PT evaluation       Communication Communication Communication: No difficulties   Cognition Arousal/Alertness: Awake/alert Behavior During Therapy: WFL for tasks assessed/performed Overall Cognitive Status: Within Functional Limits for tasks assessed                     General Comments       Exercises       Shoulder Instructions      Home Living Family/patient expects to be discharged to:: Private residence Living Arrangements: Other relatives Available Help at Discharge: Family;Available 24 hours/day   Home Access: Stairs to enter Entrance Stairs-Number of Steps: 5 Entrance Stairs-Rails: Can reach both;Left;Right Home Layout: Two level Alternate Level Stairs-Number of Steps: 13   Bathroom Shower/Tub: Chief Strategy OfficerTub/shower unit   Bathroom Toilet: Standard     Home Equipment: Cane - single point          Prior Functioning/Environment Level of Independence: Independent with assistive device(s)        Comments: used cane prn    OT Diagnosis: Acute pain   OT Problem List: Decreased strength;Decreased range of motion;Decreased activity tolerance;Pain;Decreased knowledge of use of DME or AE   OT Treatment/Interventions:      OT Goals(Current goals can be found in the care plan section)  Acute Rehab OT Goals Patient Stated Goal: to go home today OT Goal Formulation: All assessment and education complete, DC therapy  OT Frequency:     Barriers to D/C:            Co-evaluation              End of Session Equipment Utilized During Treatment: Rolling walker  Activity Tolerance: Patient tolerated treatment well Patient left: in bed;with call bell/phone within reach;with family/visitor present   Time: 1101-1111 OT Time Calculation (min): 10 min Charges:  OT General  Charges $OT Visit: 1 Procedure OT Evaluation $OT Eval Low Complexity: 1 Procedure G-Codes:    Evadene Wardrip A March 08, 2016, 1:38 PM

## 2016-02-21 DIAGNOSIS — E669 Obesity, unspecified: Secondary | ICD-10-CM | POA: Diagnosis present

## 2016-07-09 NOTE — H&P (Signed)
Jessica Lam is an 60 y.o. female.    Chief Complaint:    S/P right TKA with excessive scarring and patellar clunk  Procedure:    Excision / debridement of scar right TKA  HPI: Pt is a 60 y.o. female complaining of stiffness, crepitus and pain since the TKA. Pain had continually increased since the beginning. X-rays in the clinic show a right TKA. Pt has tried various conservative treatments which have failed to alleviate their symptoms, including analgesic medications.  Patient previously stopped PT after the TKA, I have stressed the importance of PT and getting better after the next surgery.   Various options are discussed with the patient. Risks, benefits and expectations were discussed with the patient. Patient understand the risks, benefits and expectations and wishes to proceed with surgery.   PCP: Acey Lavornelius Van Dam, MD  D/C Plans:       Home  Post-op Meds:       No Rx given   Tranexamic Acid:      To be given - IV  Decadron:      Is to be given  FYI:     ASA  Norco  NO RX FOR ANALGESIC MEDS UPON DISCHARGE   PMH: Past Medical History:  Diagnosis Date  . Arthritis    DDD -back and srthritis right knee  . COPD (chronic obstructive pulmonary disease) (HCC)   . Depression   . Gas    lots of gas and bloating issues  . Hypertension    med was discontinued over a yr-"Blood pressure too low"  . PONV (postoperative nausea and vomiting)   . Sleep apnea    no cpap used    PSH: Past Surgical History:  Procedure Laterality Date  . ABDOMINAL HYSTERECTOMY     Abdominal  . APPENDECTOMY    . CHOLECYSTECTOMY    . DIAGNOSTIC LAPAROSCOPY     "scar tissue"  . JOINT REPLACEMENT     RTKA  . LEG SURGERY Right    ORIF with plates and screws and removal, x2 surgeries I & D "infection"  . LSO Left    left Salpingo oophorectomy  . TONSILLECTOMY    . TOTAL KNEE REVISION Right 02/18/2016   Procedure: RIGHT TOTAL KNEE REVISION WITH SAPHENOUS NERVE EXCISION;  Surgeon: Durene RomansMatthew Olin, MD;   Location: [facility];  Service: Orthopedics;  Laterality: Right;  . TUBAL LIGATION      Social History:  reports that she has been smoking Cigarettes.  She has a 45.00 pack-year smoking history. She uses smokeless tobacco. She reports that she does not drink alcohol or use drugs.  Allergies:  Allergies  Allergen Reactions  . Codeine Nausea And Vomiting  . Shellfish Allergy Swelling    Swelling of the tongue   . Sulfa Antibiotics Other (See Comments)    Pt can not remember - just knows she got very sick  . Penicillins Rash    Has patient had a PCN reaction causing immediate rash, facial/tongue/throat swelling, SOB or lightheadedness with hypotension: Unknown Has patient had a PCN reaction causing severe rash involving mucus membranes or skin necrosis: Unknown Has patient had a PCN reaction that required hospitalization: Unknown Has patient had a PCN reaction occurring within the last 10 years: No  If all of the above answers are "NO", then may proceed with Cephalosporin use.     Medications: No current facility-administered medications for this encounter.    Current Outpatient Prescriptions  Medication Sig Dispense Refill  . ADVAIR  DISKUS 250-50 MCG/DOSE AEPB Take 1 puff by mouth daily as needed (for shortness of breath).     Marland Kitchen. albuterol (PROVENTIL HFA;VENTOLIN HFA) 108 (90 Base) MCG/ACT inhaler Inhale 1-2 puffs into the lungs every 6 (six) hours as needed for wheezing or shortness of breath.    Marland Kitchen. albuterol (PROVENTIL) (2.5 MG/3ML) 0.083% nebulizer solution Take 2.5 mg by nebulization every 6 (six) hours as needed for wheezing or shortness of breath.    Marland Kitchen. aspirin 81 MG chewable tablet Chew 1 tablet (81 mg total) by mouth 2 (two) times daily. Take for 4 weeks, then resume regular dose. (Patient taking differently: Chew 81 mg by mouth daily. ) 60 tablet 0  . esomeprazole (NEXIUM) 40 MG capsule Take 40 mg by mouth at bedtime.    . formoterol (PERFOROMIST) 20 MCG/2ML nebulizer solution Take  20 mcg by nebulization daily as needed (for shortness of breath).    Marland Kitchen. HYDROcodone-acetaminophen (NORCO) 10-325 MG tablet Take 1-2 tablets by mouth every 4 (four) hours as needed. 100 tablet 0  . ibuprofen (ADVIL,MOTRIN) 800 MG tablet Take 800 mg by mouth every 8 (eight) hours as needed for mild pain or moderate pain.    Marland Kitchen. levothyroxine (SYNTHROID, LEVOTHROID) 25 MCG tablet Take 25 mcg by mouth at bedtime.    . rosuvastatin (CRESTOR) 40 MG tablet Take 40 mg by mouth at bedtime.    . sertraline (ZOLOFT) 100 MG tablet Take 100 mg by mouth at bedtime.       Review of Systems  Constitutional: Negative.   HENT: Negative.   Eyes: Negative.   Respiratory: Negative.   Cardiovascular: Negative.   Gastrointestinal: Negative.   Genitourinary: Negative.   Musculoskeletal: Positive for back pain and joint pain.  Skin: Negative.   Neurological: Negative.   Endo/Heme/Allergies: Negative.   Psychiatric/Behavioral: Positive for depression.      Physical Exam  Constitutional: She is oriented to person, place, and time. She appears well-developed.  HENT:  Head: Normocephalic.  Eyes: Pupils are equal, round, and reactive to light.  Neck: Neck supple. No JVD present. No tracheal deviation present. No thyromegaly present.  Cardiovascular: Normal rate, regular rhythm, normal heart sounds and intact distal pulses.   Respiratory: Effort normal and breath sounds normal. No respiratory distress. She has no wheezes.  GI: Soft. There is no tenderness. There is no guarding.  Musculoskeletal:       Right knee: She exhibits decreased range of motion, swelling, laceration (healed previous incision) and bony tenderness. She exhibits no ecchymosis, no deformity and no erythema. Tenderness found.  Lymphadenopathy:    She has no cervical adenopathy.  Neurological: She is alert and oriented to person, place, and time.  Skin: Skin is warm and dry.  Psychiatric: She has a normal mood and affect.       Assessment/Plan Assessment:   S/P right TKA with excessive scarring and patellar clunk   Plan: Patient will undergo a excision / debridement of scar right TKA on 07/21/2016 [facility] Risks benefits and expectations were discussed with the patient. Patient understand risks, benefits and expectations and wishes to proceed.   Anastasio AuerbachMatthew S. Tyliek Timberman   PA-C  07/09/2016, 1:21 PM

## 2016-07-11 NOTE — Patient Instructions (Signed)
Jessica Lam  07/11/2016   Your procedure is scheduled on: Monday 07/21/2016  [facility]  [facility] to 3rd floor to  Short Stay Center at 130PM.  Call this number if you have problems the morning of surgery [phone]   Remember: ONLY 1 PERSON MAY GO WITH YOU TO SHORT STAY TO GET  READY MORNING OF YOUR SURGERY.  Do not eat food :After Midnight. MAY HAVE CLEAR LIQUIDS DAY OF SURGERY UNTIL 930AM. NOTHING BY MOUTH AFTER 930AM.     Take these medicines the morning of surgery with A SIP OF WATER: Advair Diskus, Albuterol inhaler or nebulizer if needed, formoterol(Performist) if    needed, hydrocodone as needed, Synthroid, Rosuvastatin(Crestor).                                You may not have any metal on your body including hair pins and              piercings  Do not wear jewelry, make-up, lotions, powders or perfumes, deodorant             Do not wear nail polish.  Do not shave  48 hours prior to surgery.              Men may shave face and neck.   Do not bring valuables to the hospital. [place] IS NOT             RESPONSIBLE   FOR VALUABLES.  Contacts, dentures or bridgework may not be worn into surgery.  Leave suitcase in the car. After surgery it may be brought to your room.    _____________________________________________________________________    CLEAR LIQUID DIET   Foods Allowed                                                                     Foods Excluded  Coffee and tea, regular and decaf                             liquids that you cannot  Plain Jell-O in any flavor                                             see through such as: Fruit ices (not with fruit pulp)                                     milk, soups, orange juice  Iced Popsicles                                    All solid food Carbonated beverages, regular and diet  Cranberry, grape and apple juices Sports  drinks like Gatorade Lightly seasoned clear broth or consume(fat free) Sugar, honey syrup  Sample Menu Breakfast                                Lunch                                     Supper Cranberry juice                    Beef broth                            Chicken broth Jell-O                                     Grape juice                           Apple juice Coffee or tea                        Jell-O                                      Popsicle                                                Coffee or tea                        Coffee or tea  _____________________________________________________________________  [facility] - Preparing for Surgery Before surgery, you can play an important role.  Because skin is not sterile, your skin needs to be as free of germs as possible.  You can reduce the number of germs on your skin by washing with CHG (chlorahexidine gluconate) soap before surgery.  CHG is an antiseptic cleaner which kills germs and bonds with the skin to continue killing germs even after washing. Please DO NOT use if you have an allergy to CHG or antibacterial soaps.  If your skin becomes reddened/irritated stop using the CHG and inform your nurse when you arrive at Short Stay. Do not shave (including legs and underarms) for at least 48 hours prior to the first CHG shower.  You may shave your face/neck. Please follow these instructions carefully:  1.  Shower with CHG Soap the night before surgery and the  morning of Surgery.  2.  If you choose to wash your hair, wash your hair first as usual with your  normal  shampoo.  3.  After you shampoo, rinse your hair and body thoroughly to remove the  shampoo.                           4.  Use CHG as you would any other liquid soap.  You can apply chg directly  to the skin and wash  Gently with a scrungie or clean washcloth.  5.  Apply the CHG Soap to your body ONLY FROM THE NECK DOWN.   Do not use on face/  open                           Wound or open sores. Avoid contact with eyes, ears mouth and genitals (private parts).                       Wash face,  Genitals (private parts) with your normal soap.             6.  Wash thoroughly, paying special attention to the area where your surgery  will be performed.  7.  Thoroughly rinse your body with warm water from the neck down.  8.  DO NOT shower/wash with your normal soap after using and rinsing off  the CHG Soap.                9.  Pat yourself dry with a clean towel.            10.  Wear clean pajamas.            11.  Place clean sheets on your bed the night of your first shower and do not  sleep with pets. Day of Surgery : Do not apply any lotions/deodorants the morning of surgery.  Please wear clean clothes to the hospital/surgery center.  FAILURE TO FOLLOW THESE INSTRUCTIONS MAY RESULT IN THE CANCELLATION OF YOUR SURGERY PATIENT SIGNATURE_________________________________  NURSE SIGNATURE__________________________________  ________________________________________________________________________  WHAT IS A BLOOD TRANSFUSION? Blood Transfusion Information  A transfusion is the replacement of blood or some of its parts. Blood is made up of multiple cells which provide different functions.  Red blood cells carry oxygen and are used for blood loss replacement.  White blood cells fight against infection.  Platelets control bleeding.  Plasma helps clot blood.  Other blood products are available for specialized needs, such as hemophilia or other clotting disorders. BEFORE THE TRANSFUSION  Who gives blood for transfusions?   Healthy volunteers who are fully evaluated to make sure their blood is safe. This is blood bank blood. Transfusion therapy is the safest it has ever been in the practice of medicine. Before blood is taken from a donor, a complete history is taken to make sure that person has no history of diseases nor engages in risky  social behavior (examples are intravenous drug use or sexual activity with multiple partners). The donor's travel history is screened to minimize risk of transmitting infections, such as malaria. The donated blood is tested for signs of infectious diseases, such as HIV and hepatitis. The blood is then tested to be sure it is compatible with you in order to minimize the chance of a transfusion reaction. If you or a relative donates blood, this is often done in anticipation of surgery and is not appropriate for emergency situations. It takes many days to process the donated blood. RISKS AND COMPLICATIONS Although transfusion therapy is very safe and saves many lives, the main dangers of transfusion include:   Getting an infectious disease.  Developing a transfusion reaction. This is an allergic reaction to something in the blood you were given. Every precaution is taken to prevent this. The decision to have a blood transfusion has been considered carefully by your caregiver before blood is given. Blood is not given unless the benefits  outweigh the risks. AFTER THE TRANSFUSION  Right after receiving a blood transfusion, you will usually feel much better and more energetic. This is especially true if your red blood cells have gotten low (anemic). The transfusion raises the level of the red blood cells which carry oxygen, and this usually causes an energy increase.  The nurse administering the transfusion will monitor you carefully for complications. HOME CARE INSTRUCTIONS  No special instructions are needed after a transfusion. You may find your energy is better. Speak with your caregiver about any limitations on activity for underlying diseases you may have. SEEK MEDICAL CARE IF:   Your condition is not improving after your transfusion.  You develop redness or irritation at the intravenous (IV) site. SEEK IMMEDIATE MEDICAL CARE IF:  Any of the following symptoms occur over the next 12  hours:  Shaking chills.  You have a temperature by mouth above 102 F (38.9 C), not controlled by medicine.  Chest, back, or muscle pain.  People around you feel you are not acting correctly or are confused.  Shortness of breath or difficulty breathing.  Dizziness and fainting.  You get a rash or develop hives.  You have a decrease in urine output.  Your urine turns a dark color or changes to pink, red, or brown. Any of the following symptoms occur over the next 10 days:  You have a temperature by mouth above 102 F (38.9 C), not controlled by medicine.  Shortness of breath.  Weakness after normal activity.  The white part of the eye turns yellow (jaundice).  You have a decrease in the amount of urine or are urinating less often.  Your urine turns a dark color or changes to pink, red, or brown. Document Released: 06/27/2000 Document Revised: 09/22/2011 Document Reviewed: 02/14/2008 [facility] Patient Information 2014 [place].  _______________________________________________________________________

## 2016-07-15 ENCOUNTER — Encounter (HOSPITAL_COMMUNITY)
Admission: RE | Admit: 2016-07-15 | Discharge: 2016-07-15 | Disposition: A | Discharge status: Home / Self Care | Payer: Medicare Other | Source: Ambulatory Visit | Attending: Infectious Disease | Admitting: Infectious Disease

## 2016-07-17 ENCOUNTER — Other Ambulatory Visit (HOSPITAL_COMMUNITY): Payer: Medicare Other

## 2016-08-11 ENCOUNTER — Encounter (HOSPITAL_COMMUNITY): Payer: Self-pay

## 2016-08-11 ENCOUNTER — Encounter (HOSPITAL_COMMUNITY)
Admission: RE | Admit: 2016-08-11 | Discharge: 2016-08-11 | Disposition: A | Discharge status: Home / Self Care | Payer: Medicare Other | Source: Ambulatory Visit | Attending: Orthopedic Surgery | Admitting: Orthopedic Surgery

## 2016-08-11 DIAGNOSIS — T8489XA Other specified complication of internal orthopedic prosthetic devices, implants and grafts, initial encounter: Secondary | ICD-10-CM | POA: Insufficient documentation

## 2016-08-11 DIAGNOSIS — Z01812 Encounter for preprocedural laboratory examination: Secondary | ICD-10-CM | POA: Insufficient documentation

## 2016-08-11 DIAGNOSIS — Z01810 Encounter for preprocedural cardiovascular examination: Secondary | ICD-10-CM | POA: Diagnosis present

## 2016-08-11 HISTORY — DX: Gastro-esophageal reflux disease without esophagitis: K21.9

## 2016-08-11 HISTORY — DX: Hypothyroidism, unspecified: E03.9

## 2016-08-11 LAB — BASIC METABOLIC PANEL
Anion gap: 10 (ref 5–15)
BUN: 10 mg/dL (ref 6–20)
CHLORIDE: 105 mmol/L (ref 101–111)
CO2: 26 mmol/L (ref 22–32)
CREATININE: 0.73 mg/dL (ref 0.44–1.00)
Calcium: 9.3 mg/dL (ref 8.9–10.3)
GFR calc non Af Amer: >60 mL/min (ref >60)
Glucose, Bld: 112 mg/dL — ABNORMAL HIGH (ref 65–99)
POTASSIUM: 3.8 mmol/L (ref 3.5–5.1)
SODIUM: 141 mmol/L (ref 135–145)

## 2016-08-11 LAB — CBC
HEMATOCRIT: 39.4 % (ref 36.0–46.0)
Hemoglobin: 13.4 g/dL (ref 12.0–15.0)
MCH: 30 pg (ref 26.0–34.0)
MCHC: 34 g/dL (ref 30.0–36.0)
MCV: 88.3 fL (ref 78.0–100.0)
PLATELETS: 217 10*3/uL (ref 150–400)
RBC: 4.46 MIL/uL (ref 3.87–5.11)
RDW: 13.6 % (ref 11.5–15.5)
WBC: 5.9 10*3/uL (ref 4.0–10.5)

## 2016-08-11 NOTE — Patient Instructions (Signed)
Jessica Lam  08/11/2016   Your procedure is scheduled on: 08/18/2016    [facility]  [place] 3rd floor to  Short Stay Center at    1130 AM.  Call this number if you have problems the morning of surgery [phone]   Remember: ONLY 1 PERSON MAY GO WITH YOU TO SHORT STAY TO GET  READY MORNING OF YOUR SURGERY.  Do not eat food after midnite.  May have clear liquids from 12 midnite until 0800am then nothing by mouth.       Take these medicines the morning of surgery with A SIP OF WATER: use inhalers and nebulizer as usual and if needed and bring inhalers with you , hydrocodone if needed                                 You may not have any metal on your body including hair pins and              piercings  Do not wear jewelry, make-up, lotions, powders or perfumes, deodorant             Do not wear nail polish.  Do not shave  48 hours prior to surgery.     Do not bring valuables to the hospital. [facility] IS NOT             RESPONSIBLE   FOR VALUABLES.  Contacts, dentures or bridgework may not be worn into surgery.  Leave suitcase in the car. After surgery it may be brought to your room.                   Please read over the following fact sheets you were given: _____________________________________________________________________                CLEAR LIQUID DIET   Foods Allowed                                                                     Foods Excluded  Coffee and tea, regular and decaf                             liquids that you cannot  Plain Jell-O in any flavor                                             see through such as: Fruit ices (not with fruit pulp)                                     milk, soups, orange juice  Iced Popsicles  All solid food Carbonated beverages, regular and diet                                    Cranberry, grape and apple juices Sports  drinks like Gatorade Lightly seasoned clear broth or consume(fat free) Sugar, honey syrup  Sample Menu Breakfast                                Lunch                                     Supper Cranberry juice                    Beef broth                            Chicken broth Jell-O                                     Grape juice                           Apple juice Coffee or tea                        Jell-O                                      Popsicle                                                Coffee or tea                        Coffee or tea  _____________________________________________________________________  [facility] - Preparing for Surgery Before surgery, you can play an important role.  Because skin is not sterile, your skin needs to be as free of germs as possible.  You can reduce the number of germs on your skin by washing with CHG (chlorahexidine gluconate) soap before surgery.  CHG is an antiseptic cleaner which kills germs and bonds with the skin to continue killing germs even after washing. Please DO NOT use if you have an allergy to CHG or antibacterial soaps.  If your skin becomes reddened/irritated stop using the CHG and inform your nurse when you arrive at Short Stay. Do not shave (including legs and underarms) for at least 48 hours prior to the first CHG shower.  You may shave your face/neck. Please follow these instructions carefully:  1.  Shower with CHG Soap the night before surgery and the  morning of Surgery.  2.  If you choose to wash your hair, wash your hair first as usual with your  normal  shampoo.  3.  After you shampoo, rinse your hair and body thoroughly to remove the  shampoo.  4.  Use CHG as you would any other liquid soap.  You can apply chg directly  to the skin and wash                       Gently with a scrungie or clean washcloth.  5.  Apply the CHG Soap to your body ONLY FROM THE NECK DOWN.   Do not use on face/  open                           Wound or open sores. Avoid contact with eyes, ears mouth and genitals (private parts).                       Wash face,  Genitals (private parts) with your normal soap.             6.  Wash thoroughly, paying special attention to the area where your surgery  will be performed.  7.  Thoroughly rinse your body with warm water from the neck down.  8.  DO NOT shower/wash with your normal soap after using and rinsing off  the CHG Soap.                9.  Pat yourself dry with a clean towel.            10.  Wear clean pajamas.            11.  Place clean sheets on your bed the night of your first shower and do not  sleep with pets. Day of Surgery : Do not apply any lotions/deodorants the morning of surgery.  Please wear clean clothes to the hospital/surgery center.  FAILURE TO FOLLOW THESE INSTRUCTIONS MAY RESULT IN THE CANCELLATION OF YOUR SURGERY PATIENT SIGNATURE_________________________________  NURSE SIGNATURE__________________________________  ________________________________________________________________________  WHAT IS A BLOOD TRANSFUSION? Blood Transfusion Information  A transfusion is the replacement of blood or some of its parts. Blood is made up of multiple cells which provide different functions.  Red blood cells carry oxygen and are used for blood loss replacement.  White blood cells fight against infection.  Platelets control bleeding.  Plasma helps clot blood.  Other blood products are available for specialized needs, such as hemophilia or other clotting disorders. BEFORE THE TRANSFUSION  Who gives blood for transfusions?   Healthy volunteers who are fully evaluated to make sure their blood is safe. This is blood bank blood. Transfusion therapy is the safest it has ever been in the practice of medicine. Before blood is taken from a donor, a complete history is taken to make sure that person has no history of diseases nor engages in risky  social behavior (examples are intravenous drug use or sexual activity with multiple partners). The donor's travel history is screened to minimize risk of transmitting infections, such as malaria. The donated blood is tested for signs of infectious diseases, such as HIV and hepatitis. The blood is then tested to be sure it is compatible with you in order to minimize the chance of a transfusion reaction. If you or a relative donates blood, this is often done in anticipation of surgery and is not appropriate for emergency situations. It takes many days to process the donated blood. RISKS AND COMPLICATIONS Although transfusion therapy is very safe and saves many lives, the main dangers of transfusion include:   Getting an infectious disease.  Developing a transfusion reaction.  This is an allergic reaction to something in the blood you were given. Every precaution is taken to prevent this. The decision to have a blood transfusion has been considered carefully by your caregiver before blood is given. Blood is not given unless the benefits outweigh the risks. AFTER THE TRANSFUSION  Right after receiving a blood transfusion, you will usually feel much better and more energetic. This is especially true if your red blood cells have gotten low (anemic). The transfusion raises the level of the red blood cells which carry oxygen, and this usually causes an energy increase.  The nurse administering the transfusion will monitor you carefully for complications. HOME CARE INSTRUCTIONS  No special instructions are needed after a transfusion. You may find your energy is better. Speak with your caregiver about any limitations on activity for underlying diseases you may have. SEEK MEDICAL CARE IF:   Your condition is not improving after your transfusion.  You develop redness or irritation at the intravenous (IV) site. SEEK IMMEDIATE MEDICAL CARE IF:  Any of the following symptoms occur over the next 12  hours:  Shaking chills.  You have a temperature by mouth above 102 F (38.9 C), not controlled by medicine.  Chest, back, or muscle pain.  People around you feel you are not acting correctly or are confused.  Shortness of breath or difficulty breathing.  Dizziness and fainting.  You get a rash or develop hives.  You have a decrease in urine output.  Your urine turns a dark color or changes to pink, red, or brown. Any of the following symptoms occur over the next 10 days:  You have a temperature by mouth above 102 F (38.9 C), not controlled by medicine.  Shortness of breath.  Weakness after normal activity.  The white part of the eye turns yellow (jaundice).  You have a decrease in the amount of urine or are urinating less often.  Your urine turns a dark color or changes to pink, red, or brown. Document Released: 06/27/2000 Document Revised: 09/22/2011 Document Reviewed: 02/14/2008 ExitCare Patient Information 2014 [place].  _______________________________________________________________________  Incentive Spirometer  An incentive spirometer is a tool that can help keep your lungs clear and active. This tool measures how well you are filling your lungs with each breath. Taking long deep breaths may help reverse or decrease the chance of developing breathing (pulmonary) problems (especially infection) following:  A long period of time when you are unable to move or be active. BEFORE THE PROCEDURE   If the spirometer includes an indicator to show your best effort, your nurse or respiratory therapist will set it to a desired goal.  If possible, sit up straight or lean slightly forward. Try not to slouch.  Hold the incentive spirometer in an upright position. INSTRUCTIONS FOR USE  1. Sit on the edge of your bed if possible, or sit up as far as you can in bed or on a chair. 2. Hold the incentive spirometer in an upright position. 3. Breathe out  normally. 4. Place the mouthpiece in your mouth and seal your lips tightly around it. 5. Breathe in slowly and as deeply as possible, raising the piston or the ball toward the top of the column. 6. Hold your breath for 3-5 seconds or for as long as possible. Allow the piston or ball to fall to the bottom of the column. 7. Remove the mouthpiece from your mouth and breathe out normally. 8. Rest for a few seconds and repeat Steps  1 through 7 at least 10 times every 1-2 hours when you are awake. Take your time and take a few normal breaths between deep breaths. 9. The spirometer may include an indicator to show your best effort. Use the indicator as a goal to work toward during each repetition. 10. After each set of 10 deep breaths, practice coughing to be sure your lungs are clear. If you have an incision (the cut made at the time of surgery), support your incision when coughing by placing a pillow or rolled up towels firmly against it. Once you are able to get out of bed, walk around indoors and cough well. You may stop using the incentive spirometer when instructed by your caregiver.  RISKS AND COMPLICATIONS  Take your time so you do not get dizzy or light-headed.  If you are in pain, you may need to take or ask for pain medication before doing incentive spirometry. It is harder to take a deep breath if you are having pain. AFTER USE  Rest and breathe slowly and easily.  It can be helpful to keep track of a log of your progress. Your caregiver can provide you with a simple table to help with this. If you are using the spirometer at home, follow these instructions: [place] IF:   You are having difficultly using the spirometer.  You have trouble using the spirometer as often as instructed.  Your pain medication is not giving enough relief while using the spirometer.  You develop fever of 100.5 F (38.1 C) or higher. SEEK IMMEDIATE MEDICAL CARE IF:   You cough up bloody sputum  that had not been present before.  You develop fever of 102 F (38.9 C) or greater.  You develop worsening pain at or near the incision site. MAKE SURE YOU:   Understand these instructions.  Will watch your condition.  Will get help right away if you are not doing well or get worse. Document Released: 11/10/2006 Document Revised: 09/22/2011 Document Reviewed: 01/11/2007 [facility] Patient Information 2014 [place], [place].   ________________________________________________________________________

## 2016-08-12 NOTE — Progress Notes (Signed)
Final EKG done 08/11/16- epic

## 2016-08-18 ENCOUNTER — Ambulatory Visit (HOSPITAL_COMMUNITY): Payer: Medicare Other | Admitting: Certified Registered Nurse Anesthetist

## 2016-08-18 ENCOUNTER — Encounter (HOSPITAL_COMMUNITY): Payer: Self-pay | Admitting: *Deleted

## 2016-08-18 ENCOUNTER — Observation Stay (HOSPITAL_COMMUNITY)
Admission: RE | Admit: 2016-08-18 | Discharge: 2016-08-19 | Disposition: A | Discharge status: Home / Self Care | Payer: Medicare Other | Source: Ambulatory Visit | Attending: Orthopedic Surgery | Admitting: Orthopedic Surgery

## 2016-08-18 ENCOUNTER — Encounter (HOSPITAL_COMMUNITY)
Admission: RE | Disposition: A | Discharge status: Home / Self Care | Payer: Self-pay | Source: Ambulatory Visit | Attending: Orthopedic Surgery

## 2016-08-18 DIAGNOSIS — Z885 Allergy status to narcotic agent status: Secondary | ICD-10-CM | POA: Insufficient documentation

## 2016-08-18 DIAGNOSIS — G4730 Sleep apnea, unspecified: Secondary | ICD-10-CM | POA: Insufficient documentation

## 2016-08-18 DIAGNOSIS — Z880 Allergy status to penicillin: Secondary | ICD-10-CM | POA: Diagnosis not present

## 2016-08-18 DIAGNOSIS — Z720 Tobacco use: Secondary | ICD-10-CM | POA: Insufficient documentation

## 2016-08-18 DIAGNOSIS — Z882 Allergy status to sulfonamides status: Secondary | ICD-10-CM | POA: Diagnosis not present

## 2016-08-18 DIAGNOSIS — I10 Essential (primary) hypertension: Secondary | ICD-10-CM | POA: Insufficient documentation

## 2016-08-18 DIAGNOSIS — F329 Major depressive disorder, single episode, unspecified: Secondary | ICD-10-CM | POA: Insufficient documentation

## 2016-08-18 DIAGNOSIS — Z96651 Presence of right artificial knee joint: Secondary | ICD-10-CM | POA: Diagnosis not present

## 2016-08-18 DIAGNOSIS — Z91013 Allergy to seafood: Secondary | ICD-10-CM | POA: Insufficient documentation

## 2016-08-18 DIAGNOSIS — E039 Hypothyroidism, unspecified: Secondary | ICD-10-CM | POA: Insufficient documentation

## 2016-08-18 DIAGNOSIS — L910 Hypertrophic scar: Secondary | ICD-10-CM | POA: Diagnosis not present

## 2016-08-18 DIAGNOSIS — K219 Gastro-esophageal reflux disease without esophagitis: Secondary | ICD-10-CM | POA: Diagnosis not present

## 2016-08-18 DIAGNOSIS — M238X2 Other internal derangements of left knee: Secondary | ICD-10-CM | POA: Diagnosis not present

## 2016-08-18 DIAGNOSIS — M25861 Other specified joint disorders, right knee: Principal | ICD-10-CM | POA: Insufficient documentation

## 2016-08-18 DIAGNOSIS — J449 Chronic obstructive pulmonary disease, unspecified: Secondary | ICD-10-CM | POA: Diagnosis not present

## 2016-08-18 HISTORY — PX: SCAR DEBRIDEMENT OF TOTAL KNEE: SHX6544

## 2016-08-18 LAB — TYPE AND SCREEN
ABO/RH(D): O POS
Antibody Screen: NEGATIVE

## 2016-08-18 SURGERY — REVISION, SCAR, KNEE
Anesthesia: General | Site: Knee | Laterality: Right

## 2016-08-18 MED ORDER — MIDAZOLAM HCL 5 MG/5ML IJ SOLN
INTRAMUSCULAR | Status: DC | PRN
  Administered 2016-08-18: 2 mg via INTRAVENOUS

## 2016-08-18 MED ORDER — ALUM & MAG HYDROXIDE-SIMETH 200-200-20 MG/5ML PO SUSP: 30.0000 mL | ORAL | Status: DC | PRN

## 2016-08-18 MED ORDER — FENTANYL CITRATE (PF) 100 MCG/2ML IJ SOLN
INTRAMUSCULAR | Status: AC
  Filled 2016-08-18: qty 4

## 2016-08-18 MED ORDER — METHOCARBAMOL 1000 MG/10ML IJ SOLN
500.0000 mg | Freq: Four times a day (QID) | INTRAMUSCULAR | Status: DC | PRN
  Administered 2016-08-18: 500 mg via INTRAVENOUS
  Filled 2016-08-18: qty 550
  Filled 2016-08-18: qty 5

## 2016-08-18 MED ORDER — GENTAMICIN SULFATE 40 MG/ML IJ SOLN
5.0000 mg/kg | Freq: Once | INTRAVENOUS | Status: AC
  Administered 2016-08-18: 330 mg via INTRAVENOUS
  Filled 2016-08-18: qty 8.25

## 2016-08-18 MED ORDER — ESOMEPRAZOLE MAGNESIUM 40 MG PO CPDR
40.0000 mg | DELAYED_RELEASE_CAPSULE | Freq: Every day | ORAL | Status: DC
  Administered 2016-08-18: 40 mg via ORAL
  Filled 2016-08-18: qty 1

## 2016-08-18 MED ORDER — MAGNESIUM CITRATE PO SOLN: 1.0000 | Freq: Once | ORAL | Status: DC | PRN

## 2016-08-18 MED ORDER — KETOROLAC TROMETHAMINE 15 MG/ML IJ SOLN
15.0000 mg | Freq: Four times a day (QID) | INTRAMUSCULAR | Status: DC
  Administered 2016-08-18 – 2016-08-19 (×3): 15 mg via INTRAVENOUS
  Filled 2016-08-18 (×3): qty 1

## 2016-08-18 MED ORDER — LACTATED RINGERS IV SOLN
INTRAVENOUS | Status: DC
  Administered 2016-08-18 (×2): via INTRAVENOUS

## 2016-08-18 MED ORDER — DIPHENHYDRAMINE HCL 25 MG PO CAPS: 25.0000 mg | ORAL_CAPSULE | Freq: Four times a day (QID) | ORAL | Status: DC | PRN

## 2016-08-18 MED ORDER — BUPIVACAINE HCL (PF) 0.25 % IJ SOLN
INTRAMUSCULAR | Status: DC | PRN
  Administered 2016-08-18: 30 mL

## 2016-08-18 MED ORDER — SODIUM CHLORIDE 0.9 % IJ SOLN
INTRAMUSCULAR | Status: AC
  Filled 2016-08-18: qty 50

## 2016-08-18 MED ORDER — METOCLOPRAMIDE HCL 5 MG/ML IJ SOLN: 5.0000 mg | Freq: Three times a day (TID) | INTRAMUSCULAR | Status: DC | PRN

## 2016-08-18 MED ORDER — 0.9 % SODIUM CHLORIDE (POUR BTL) OPTIME
TOPICAL | Status: DC | PRN
  Administered 2016-08-18: 1000 mL

## 2016-08-18 MED ORDER — VANCOMYCIN HCL IN DEXTROSE 1-5 GM/200ML-% IV SOLN
1000.0000 mg | INTRAVENOUS | Status: AC
  Administered 2016-08-18: 1000 mg via INTRAVENOUS
  Filled 2016-08-18: qty 200

## 2016-08-18 MED ORDER — ONDANSETRON HCL 4 MG/2ML IJ SOLN
INTRAMUSCULAR | Status: AC
  Filled 2016-08-18: qty 2

## 2016-08-18 MED ORDER — DEXAMETHASONE SODIUM PHOSPHATE 10 MG/ML IJ SOLN: 10.0000 mg | Freq: Once | INTRAMUSCULAR | Status: DC

## 2016-08-18 MED ORDER — SODIUM CHLORIDE 0.9 % IR SOLN
Status: DC | PRN
  Administered 2016-08-18: 1000 mL

## 2016-08-18 MED ORDER — NON FORMULARY: 40.0000 mg | Freq: Every day | Status: DC

## 2016-08-18 MED ORDER — PROPOFOL 10 MG/ML IV BOLUS
INTRAVENOUS | Status: DC | PRN
  Administered 2016-08-18: 50 mg via INTRAVENOUS
  Administered 2016-08-18: 200 mg via INTRAVENOUS

## 2016-08-18 MED ORDER — ASPIRIN 81 MG PO CHEW
81.0000 mg | CHEWABLE_TABLET | Freq: Two times a day (BID) | ORAL | Status: DC
  Administered 2016-08-18 – 2016-08-19 (×2): 81 mg via ORAL
  Filled 2016-08-18 (×2): qty 1

## 2016-08-18 MED ORDER — BUPIVACAINE HCL (PF) 0.25 % IJ SOLN
INTRAMUSCULAR | Status: AC
  Filled 2016-08-18: qty 30

## 2016-08-18 MED ORDER — SODIUM CHLORIDE 0.9 % IV SOLN
INTRAVENOUS | Status: DC
  Administered 2016-08-18: 18:00:00 via INTRAVENOUS
  Filled 2016-08-18 (×3): qty 1000

## 2016-08-18 MED ORDER — METHOCARBAMOL 500 MG PO TABS: 500.0000 mg | ORAL_TABLET | Freq: Four times a day (QID) | ORAL | 0 refills | Status: DC | PRN

## 2016-08-18 MED ORDER — FERROUS SULFATE 325 (65 FE) MG PO TABS
325.0000 mg | ORAL_TABLET | Freq: Three times a day (TID) | ORAL | Status: DC
  Administered 2016-08-19: 09:00:00 325 mg via ORAL
  Filled 2016-08-18: qty 1

## 2016-08-18 MED ORDER — METHOCARBAMOL 500 MG PO TABS
500.0000 mg | ORAL_TABLET | Freq: Four times a day (QID) | ORAL | Status: DC | PRN
  Administered 2016-08-19: 09:00:00 500 mg via ORAL
  Filled 2016-08-18: qty 1

## 2016-08-18 MED ORDER — ARFORMOTEROL TARTRATE 15 MCG/2ML IN NEBU: 15.0000 ug | INHALATION_SOLUTION | Freq: Two times a day (BID) | RESPIRATORY_TRACT | Status: DC | PRN

## 2016-08-18 MED ORDER — DOCUSATE SODIUM 100 MG PO CAPS
100.0000 mg | ORAL_CAPSULE | Freq: Two times a day (BID) | ORAL | Status: DC
  Administered 2016-08-19: 09:00:00 100 mg via ORAL
  Filled 2016-08-18 (×2): qty 1

## 2016-08-18 MED ORDER — KETOROLAC TROMETHAMINE 30 MG/ML IJ SOLN
INTRAMUSCULAR | Status: AC
  Filled 2016-08-18: qty 1

## 2016-08-18 MED ORDER — VANCOMYCIN HCL IN DEXTROSE 1-5 GM/200ML-% IV SOLN
1000.0000 mg | Freq: Two times a day (BID) | INTRAVENOUS | Status: AC
  Administered 2016-08-19: 01:00:00 1000 mg via INTRAVENOUS
  Filled 2016-08-18: qty 200

## 2016-08-18 MED ORDER — ONDANSETRON HCL 4 MG/2ML IJ SOLN: 4.0000 mg | Freq: Once | INTRAMUSCULAR | Status: DC | PRN

## 2016-08-18 MED ORDER — LEVOTHYROXINE SODIUM 25 MCG PO TABS
25.0000 ug | ORAL_TABLET | Freq: Every day | ORAL | Status: DC
  Administered 2016-08-18: 22:00:00 25 ug via ORAL
  Filled 2016-08-18: qty 1

## 2016-08-18 MED ORDER — CHLORHEXIDINE GLUCONATE 4 % EX LIQD: 60.0000 mL | Freq: Once | CUTANEOUS | Status: DC

## 2016-08-18 MED ORDER — SCOPOLAMINE 1 MG/3DAYS TD PT72
MEDICATED_PATCH | TRANSDERMAL | Status: DC | PRN
  Administered 2016-08-18: 1 via TRANSDERMAL

## 2016-08-18 MED ORDER — ONDANSETRON HCL 4 MG PO TABS: 4.0000 mg | ORAL_TABLET | Freq: Four times a day (QID) | ORAL | Status: DC | PRN

## 2016-08-18 MED ORDER — ALBUTEROL SULFATE HFA 108 (90 BASE) MCG/ACT IN AERS: 1.0000 | INHALATION_SPRAY | Freq: Four times a day (QID) | RESPIRATORY_TRACT | Status: DC | PRN

## 2016-08-18 MED ORDER — SODIUM CHLORIDE 0.9 % IJ SOLN
INTRAMUSCULAR | Status: DC | PRN
  Administered 2016-08-18: 30 mL

## 2016-08-18 MED ORDER — BISACODYL 10 MG RE SUPP: 10.0000 mg | Freq: Every day | RECTAL | Status: DC | PRN

## 2016-08-18 MED ORDER — SERTRALINE HCL 100 MG PO TABS
100.0000 mg | ORAL_TABLET | Freq: Every day | ORAL | Status: DC
  Administered 2016-08-18: 22:00:00 100 mg via ORAL
  Filled 2016-08-18: qty 1

## 2016-08-18 MED ORDER — TRANEXAMIC ACID 1000 MG/10ML IV SOLN
1000.0000 mg | INTRAVENOUS | Status: AC
  Administered 2016-08-18: 1000 mg via INTRAVENOUS
  Filled 2016-08-18: qty 1100

## 2016-08-18 MED ORDER — ASPIRIN 81 MG PO CHEW: 81.0000 mg | CHEWABLE_TABLET | Freq: Two times a day (BID) | ORAL | 0 refills | Status: AC

## 2016-08-18 MED ORDER — FENTANYL CITRATE (PF) 100 MCG/2ML IJ SOLN
INTRAMUSCULAR | Status: DC | PRN
  Administered 2016-08-18 (×4): 50 ug via INTRAVENOUS

## 2016-08-18 MED ORDER — POLYETHYLENE GLYCOL 3350 17 G PO PACK
17.0000 g | PACK | Freq: Two times a day (BID) | ORAL | Status: DC
  Administered 2016-08-19: 09:00:00 17 g via ORAL
  Filled 2016-08-18 (×2): qty 1

## 2016-08-18 MED ORDER — KETOROLAC TROMETHAMINE 30 MG/ML IJ SOLN
INTRAMUSCULAR | Status: DC | PRN
  Administered 2016-08-18: 30 mg

## 2016-08-18 MED ORDER — FENTANYL CITRATE (PF) 100 MCG/2ML IJ SOLN
25.0000 ug | INTRAMUSCULAR | Status: DC | PRN
  Administered 2016-08-18: 50 ug via INTRAVENOUS

## 2016-08-18 MED ORDER — ONDANSETRON HCL 4 MG/2ML IJ SOLN: 4.0000 mg | Freq: Four times a day (QID) | INTRAMUSCULAR | Status: DC | PRN

## 2016-08-18 MED ORDER — MIDAZOLAM HCL 2 MG/2ML IJ SOLN
INTRAMUSCULAR | Status: AC
  Filled 2016-08-18: qty 2

## 2016-08-18 MED ORDER — HYDROMORPHONE HCL 1 MG/ML IJ SOLN
0.5000 mg | INTRAMUSCULAR | Status: DC | PRN
Start: 2016-08-18 — End: 2016-08-19

## 2016-08-18 MED ORDER — METOCLOPRAMIDE HCL 5 MG PO TABS: 5.0000 mg | ORAL_TABLET | Freq: Three times a day (TID) | ORAL | Status: DC | PRN

## 2016-08-18 MED ORDER — DOCUSATE SODIUM 100 MG PO CAPS
100.0000 mg | ORAL_CAPSULE | Freq: Two times a day (BID) | ORAL | 0 refills | Status: DC
Start: 2016-08-18 — End: 2023-03-09

## 2016-08-18 MED ORDER — FENTANYL CITRATE (PF) 100 MCG/2ML IJ SOLN
INTRAMUSCULAR | Status: AC
  Filled 2016-08-18: qty 2

## 2016-08-18 MED ORDER — ACETAMINOPHEN 10 MG/ML IV SOLN
INTRAVENOUS | Status: DC | PRN
  Administered 2016-08-18: 1000 mg via INTRAVENOUS

## 2016-08-18 MED ORDER — MOMETASONE FURO-FORMOTEROL FUM 200-5 MCG/ACT IN AERO
2.0000 | INHALATION_SPRAY | Freq: Two times a day (BID) | RESPIRATORY_TRACT | Status: DC | PRN
  Filled 2016-08-18: qty 8.8

## 2016-08-18 MED ORDER — LIDOCAINE 2% (20 MG/ML) 5 ML SYRINGE
INTRAMUSCULAR | Status: AC
  Filled 2016-08-18: qty 5

## 2016-08-18 MED ORDER — POLYETHYLENE GLYCOL 3350 17 G PO PACK: 17.0000 g | PACK | Freq: Two times a day (BID) | ORAL | 0 refills | Status: DC

## 2016-08-18 MED ORDER — EPHEDRINE SULFATE 50 MG/ML IJ SOLN
INTRAMUSCULAR | Status: DC | PRN
Start: 2016-08-18 — End: 2016-08-18
  Administered 2016-08-18 (×2): 10 mg via INTRAVENOUS

## 2016-08-18 MED ORDER — ROSUVASTATIN CALCIUM 20 MG PO TABS
40.0000 mg | ORAL_TABLET | Freq: Every day | ORAL | Status: DC
  Administered 2016-08-18: 22:00:00 40 mg via ORAL
  Filled 2016-08-18: qty 2

## 2016-08-18 MED ORDER — ACETAMINOPHEN 10 MG/ML IV SOLN
INTRAVENOUS | Status: AC
Start: 2016-08-18 — End: 2016-08-18
  Filled 2016-08-18: qty 100

## 2016-08-18 MED ORDER — LIDOCAINE 2% (20 MG/ML) 5 ML SYRINGE
INTRAMUSCULAR | Status: DC | PRN
  Administered 2016-08-18: 100 mg via INTRAVENOUS

## 2016-08-18 MED ORDER — SCOPOLAMINE 1 MG/3DAYS TD PT72
MEDICATED_PATCH | TRANSDERMAL | Status: AC
  Filled 2016-08-18: qty 1

## 2016-08-18 MED ORDER — FERROUS SULFATE 325 (65 FE) MG PO TABS: 325.0000 mg | ORAL_TABLET | Freq: Three times a day (TID) | ORAL | Status: AC

## 2016-08-18 MED ORDER — PHENOL 1.4 % MT LIQD
1.0000 | OROMUCOSAL | Status: DC | PRN
  Filled 2016-08-18: qty 177

## 2016-08-18 MED ORDER — PROPOFOL 10 MG/ML IV BOLUS
INTRAVENOUS | Status: AC
  Filled 2016-08-18: qty 40

## 2016-08-18 MED ORDER — MENTHOL 3 MG MT LOZG: 1.0000 | LOZENGE | OROMUCOSAL | Status: DC | PRN

## 2016-08-18 MED ORDER — DEXAMETHASONE SODIUM PHOSPHATE 10 MG/ML IJ SOLN
10.0000 mg | Freq: Once | INTRAMUSCULAR | Status: AC
  Administered 2016-08-18: 10 mg via INTRAVENOUS

## 2016-08-18 MED ORDER — HYDROCODONE-ACETAMINOPHEN 7.5-325 MG PO TABS
1.0000 | ORAL_TABLET | ORAL | Status: DC
  Administered 2016-08-18 – 2016-08-19 (×4): 1 via ORAL
  Filled 2016-08-18: qty 2
  Filled 2016-08-18: qty 1
  Filled 2016-08-18: qty 2
  Filled 2016-08-18: qty 1

## 2016-08-18 MED ORDER — ALBUTEROL SULFATE (2.5 MG/3ML) 0.083% IN NEBU: 2.5000 mg | INHALATION_SOLUTION | Freq: Four times a day (QID) | RESPIRATORY_TRACT | Status: DC | PRN

## 2016-08-18 MED ORDER — DEXAMETHASONE SODIUM PHOSPHATE 10 MG/ML IJ SOLN
INTRAMUSCULAR | Status: AC
  Filled 2016-08-18: qty 1

## 2016-08-18 SURGICAL SUPPLY — 52 items
ADH SKN CLS APL DERMABOND .7 (GAUZE/BANDAGES/DRESSINGS) ×1
BAG SPEC THK2 15X12 ZIP CLS (MISCELLANEOUS) ×1
BAG ZIPLOCK 12X15 (MISCELLANEOUS) ×3 IMPLANT
BANDAGE ACE 4X5 VEL STRL LF (GAUZE/BANDAGES/DRESSINGS) ×2 IMPLANT
BANDAGE ACE 6X5 VEL STRL LF (GAUZE/BANDAGES/DRESSINGS) ×3 IMPLANT
BNDG COHESIVE 6X5 TAN STRL LF (GAUZE/BANDAGES/DRESSINGS) ×2 IMPLANT
CLOTH BEACON ORANGE TIMEOUT ST (SAFETY) ×3 IMPLANT
CUFF TOURN SGL QUICK 34 (TOURNIQUET CUFF) ×3
CUFF TRNQT CYL 34X4X40X1 (TOURNIQUET CUFF) ×1 IMPLANT
DECANTER SPIKE VIAL GLASS SM (MISCELLANEOUS) ×3 IMPLANT
DERMABOND ADVANCED (GAUZE/BANDAGES/DRESSINGS) ×2
DERMABOND ADVANCED .7 DNX12 (GAUZE/BANDAGES/DRESSINGS) IMPLANT
DRAPE U-SHAPE 47X51 STRL (DRAPES) ×3 IMPLANT
DRSG AQUACEL AG ADV 3.5X10 (GAUZE/BANDAGES/DRESSINGS) IMPLANT
DRSG AQUACEL AG ADV 3.5X14 (GAUZE/BANDAGES/DRESSINGS) IMPLANT
DURAPREP 26ML APPLICATOR (WOUND CARE) ×4 IMPLANT
ELECT REM PT RETURN 9FT ADLT (ELECTROSURGICAL) ×3
ELECTRODE REM PT RTRN 9FT ADLT (ELECTROSURGICAL) ×1 IMPLANT
GLOVE BIOGEL M 7.0 STRL (GLOVE) IMPLANT
GLOVE BIOGEL PI IND STRL 7.5 (GLOVE) ×1 IMPLANT
GLOVE BIOGEL PI IND STRL 8.5 (GLOVE) ×1 IMPLANT
GLOVE BIOGEL PI INDICATOR 7.5 (GLOVE) ×2
GLOVE BIOGEL PI INDICATOR 8.5 (GLOVE) ×2
GLOVE ECLIPSE 8.0 STRL XLNG CF (GLOVE) ×3 IMPLANT
GLOVE ORTHO TXT STRL SZ7.5 (GLOVE) ×3 IMPLANT
GOWN STRL REUS W/TWL LRG LVL3 (GOWN DISPOSABLE) ×3 IMPLANT
GOWN STRL REUS W/TWL XL LVL3 (GOWN DISPOSABLE) ×3 IMPLANT
HANDPIECE INTERPULSE COAX TIP (DISPOSABLE) ×3
MANIFOLD NEPTUNE II (INSTRUMENTS) ×3 IMPLANT
NDL SAFETY ECLIPSE 18X1.5 (NEEDLE) IMPLANT
NEEDLE HYPO 18GX1.5 SHARP (NEEDLE)
NS IRRIG 1000ML POUR BTL (IV SOLUTION) ×6 IMPLANT
PACK TOTAL KNEE CUSTOM (KITS) ×3 IMPLANT
POSITIONER SURGICAL ARM (MISCELLANEOUS) ×3 IMPLANT
SET HNDPC FAN SPRY TIP SCT (DISPOSABLE) ×1 IMPLANT
SET PAD KNEE POSITIONER (MISCELLANEOUS) ×2 IMPLANT
SPONGE LAP 18X18 X RAY DECT (DISPOSABLE) IMPLANT
STAPLER VISISTAT 35W (STAPLE) IMPLANT
SUT MNCRL AB 3-0 PS2 18 (SUTURE) ×2 IMPLANT
SUT MNCRL AB 4-0 PS2 18 (SUTURE) ×2 IMPLANT
SUT STRATAFIX 0 PDS 27 VIOLET (SUTURE) ×3
SUT VIC AB 1 CT1 36 (SUTURE) ×3 IMPLANT
SUT VIC AB 2-0 CT1 27 (SUTURE) ×9
SUT VIC AB 2-0 CT1 TAPERPNT 27 (SUTURE) ×3 IMPLANT
SUT VLOC 180 0 24IN GS25 (SUTURE) IMPLANT
SUTURE STRATFX 0 PDS 27 VIOLET (SUTURE) IMPLANT
SWAB COLLECTION DEVICE MRSA (MISCELLANEOUS) IMPLANT
SWAB CULTURE ESWAB REG 1ML (MISCELLANEOUS) IMPLANT
SYR 50ML LL SCALE MARK (SYRINGE) ×3 IMPLANT
TRAY FOLEY W/METER SILVER 16FR (SET/KITS/TRAYS/PACK) ×1 IMPLANT
WATER STERILE IRR 1500ML POUR (IV SOLUTION) ×3 IMPLANT
WRAP KNEE MAXI GEL POST OP (GAUZE/BANDAGES/DRESSINGS) ×3 IMPLANT

## 2016-08-18 NOTE — Anesthesia Procedure Notes (Signed)
Procedure Name: LMA Insertion Date/Time: 08/18/2016 1:41 PM Performed by: Epimenio SarinJARVELA, Arun Herrod R Pre-anesthesia Checklist: Patient identified, Emergency Drugs available, Suction available, Patient being monitored and Timeout performed Patient Re-evaluated:Patient Re-evaluated prior to inductionOxygen Delivery Method: Circle system utilized Preoxygenation: Pre-oxygenation with 100% oxygen Intubation Type: IV induction Ventilation: Mask ventilation without difficulty LMA: LMA with gastric port inserted LMA Size: 4.0 Number of attempts: 1 Dental Injury: Teeth and Oropharynx as per pre-operative assessment

## 2016-08-18 NOTE — Transfer of Care (Signed)
  Immediate Anesthesia Transfer of Care Note  Patient: Jessica Lam  Procedure(s) Performed: Procedure(s): SCAR DEBRIDEMENT OF RIGHT TOTAL KNEE (Right)  Patient Location: PACU  Anesthesia Type:General  Level of Consciousness:  sedated, patient cooperative and responds to stimulation  Airway & Oxygen Therapy:Patient Spontanous Breathing and Patient connected to face mask oxgen  Post-op Assessment:  Report given to PACU RN and Post -op Vital signs reviewed and stable  Post vital signs:  Reviewed and stable  Last Vitals:  Vitals:   08/18/16 1110  BP: 135/71  Pulse: 69  Resp: 16  Temp: 36.7 C    Complications: No apparent anesthesia complications

## 2016-08-18 NOTE — Brief Op Note (Signed)
08/18/2016  1:39 PM  PATIENT:  Jessica Lam  61 y.o. 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  BLOOD ADMINISTERED:none  DRAINS: none   LOCAL MEDICATIONS USED:  MARCAINE     SPECIMEN:  No Specimen  DISPOSITION OF SPECIMEN:  N/A  COUNTS:  YES  TOURNIQUET:   18 min at 250 mmHg  DICTATION: .Other Dictation: Dictation Number [id number]  PLAN OF CARE: Admit for overnight observation  PATIENT DISPOSITION:  PACU - hemodynamically stable.   Delay start of Pharmacological VTE agent (>24hrs) due to surgical blood loss or risk of bleeding: no

## 2016-08-18 NOTE — Interval H&P Note (Signed)
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 After consideration of risks, benefits and other options for treatment, the patient has consented to  Procedure(s): SCAR DEBRIDEMENT OF RIGHT TOTAL KNEE (Right) as a surgical intervention .  The patient's history has been reviewed, patient examined, no change in status, stable for surgery.  I have reviewed the patient's chart and labs.  Questions were answered to the patient's satisfaction.     Shelda PalLIN,Rockne Dearinger D

## 2016-08-18 NOTE — Anesthesia Postprocedure Evaluation (Addendum)
Anesthesia Post Note  Patient: Jessica Lam  Procedure(s) Performed: Procedure(s) (LRB): SCAR DEBRIDEMENT OF RIGHT TOTAL KNEE (Right)  Patient location during evaluation: PACU Anesthesia Type: General Level of consciousness: awake, awake and alert and oriented Pain management: pain level controlled Vital Signs Assessment: post-procedure vital signs reviewed and stable Respiratory status: spontaneous breathing, nonlabored ventilation and respiratory function stable Cardiovascular status: blood pressure returned to baseline Postop Assessment: no headache Anesthetic complications: no       Last Vitals:  Vitals:   08/18/16 1626 08/18/16 1735  BP: 138/79 128/77  Pulse: 63 63  Resp: 15 16  Temp: 37 C 36.4 C    Last Pain:  Vitals:   08/18/16 1735  TempSrc: Axillary  PainSc:                  Asma Boldon COKER

## 2016-08-18 NOTE — Discharge Instructions (Signed)

## 2016-08-18 NOTE — Anesthesia Preprocedure Evaluation (Signed)
Anesthesia Evaluation  Patient identified by MRN, date of birth, ID band Patient awake    Reviewed: Allergy & Precautions, NPO status , Patient's Chart, lab work & pertinent test results  Airway Mallampati: II  TM Distance: >3 FB Neck ROM: Full    Dental  (+) Edentulous Upper, Edentulous Lower   Pulmonary Current Smoker,    breath sounds clear to auscultation       Cardiovascular hypertension,  Rhythm:Regular Rate:Normal     Neuro/Psych    GI/Hepatic   Endo/Other    Renal/GU      Musculoskeletal   Abdominal   Peds  Hematology   Anesthesia Other Findings   Reproductive/Obstetrics                             Anesthesia Physical Anesthesia Plan  ASA: III  Anesthesia Plan: General   Post-op Pain Management:    Induction: Intravenous  Airway Management Planned: LMA  Additional Equipment:   Intra-op Plan:   Post-operative Plan:   Informed Consent: I have reviewed the patients History and Physical, chart, labs and discussed the procedure including the risks, benefits and alternatives for the proposed anesthesia with the patient or authorized representative who has indicated his/her understanding and acceptance.     Plan Discussed with: CRNA and Anesthesiologist  Anesthesia Plan Comments:         Anesthesia Quick Evaluation  

## 2016-08-19 DIAGNOSIS — M25861 Other specified joint disorders, right knee: Secondary | ICD-10-CM | POA: Diagnosis not present

## 2016-08-19 LAB — CBC
HEMATOCRIT: 37.2 % (ref 36.0–46.0)
HEMOGLOBIN: 12.9 g/dL (ref 12.0–15.0)
MCH: 31.4 pg (ref 26.0–34.0)
MCHC: 34.7 g/dL (ref 30.0–36.0)
MCV: 90.5 fL (ref 78.0–100.0)
Platelets: 196 10*3/uL (ref 150–400)
RBC: 4.11 MIL/uL (ref 3.87–5.11)
RDW: 13.6 % (ref 11.5–15.5)
WBC: 11.2 10*3/uL — AB (ref 4.0–10.5)

## 2016-08-19 LAB — BASIC METABOLIC PANEL
ANION GAP: 9 (ref 5–15)
BUN: 12 mg/dL (ref 6–20)
CHLORIDE: 101 mmol/L (ref 101–111)
CO2: 26 mmol/L (ref 22–32)
Calcium: 8.7 mg/dL — ABNORMAL LOW (ref 8.9–10.3)
Creatinine, Ser: 0.74 mg/dL (ref 0.44–1.00)
GFR calc non Af Amer: >60 mL/min (ref >60)
Glucose, Bld: 155 mg/dL — ABNORMAL HIGH (ref 65–99)
Potassium: 4.8 mmol/L (ref 3.5–5.1)
Sodium: 136 mmol/L (ref 135–145)

## 2016-08-19 NOTE — Progress Notes (Signed)
Patient ID: Jessica Lam, female   DOB: Feb 17, 1956, [age] y.o.   MRN: [id number] Subjective: 1 Day Post-Op Procedure(s) (LRB): SCAR DEBRIDEMENT OF RIGHT TOTAL KNEE (Right)    Patient reports pain as mild.  Has done well.  Ready to progress, has therapy set up already   Objective:   VITALS:   Vitals:   08/19/16 0153 08/19/16 0501  BP: 119/68 (!) 119/55  Pulse: (!) 56 62  Resp: 16 16  Temp: 98.4 F (36.9 C) 98 F (36.7 C)    Neurovascular intact Incision: dressing C/D/I  LABS  Recent Labs  08/19/16 0437  HGB 12.9  HCT 37.2  WBC 11.2*  PLT 196     Recent Labs  08/19/16 0437  NA 136  K 4.8  BUN 12  CREATININE 0.74  GLUCOSE 155*    No results for input(s): LABPT, INR in the last 72 hours.   Assessment/Plan: 1 Day Post-Op Procedure(s) (LRB): SCAR DEBRIDEMENT OF RIGHT TOTAL KNEE (Right)   Advance diet Up with therapy   D/C to home this am after PT visit RTC in 2 weeks PT already set up in office Reviewed goals

## 2016-08-19 NOTE — Op Note (Addendum)
NAMErnie Hew:  Hurwitz, Lindley                  ACCOUNT NO.:  [id number]  MEDICAL RECORD NO.:  [id number]  LOCATION:                                 FACILITY:  PHYSICIAN:  Madlyn FrankelMatthew D. Charlann Boxerlin, M.D.  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  PROCEDURE:  Excision of right knee scar.  This included a 10 inch hypertrophic incision with sharp excisional debridement of the synovium and excessive scarring in the parapatellar region as well as in the medial lateral gutters and suprapatellar pouch.  SURGEON:  Madlyn FrankelMatthew D. Charlann Boxerlin, M.D.  ASSISTANT:  Lanney GinsMatthew Babish, PA-C.  ANESTHESIA:  General.  SPECIMENS:  None.  COMPLICATION:  None.  TOURNIQUET TIME:  18 minutes at 250 mmHg.  INDICATIONS FOR PROCEDURE:  [name] is a 61 year old female status post revision of right total knee arthroplasty.  She presented to me within the last month with significant crepitation and the patella clunk, description of her symptoms extending to her leg from a flexed position with loud painful pop.  I reviewed with her this diagnosis.  She had also raised concerns about a valgus appearance to her knee that we would assess intraoperatively.  I was worried this is more weakness in her gluteal or her hip musculature and quads resulted in her having an internal rotation of the hip gait pattern.  Consent was obtained for benefit of pain relief.  PROCEDURE IN DETAIL:  The patient was brought to the operative theater. Once adequate anesthesia, preoperative antibiotics, Ancef administered, she was positioned supine.  A time-out was performed  identifying the patient, planned procedure, and extremity.  The right lower extremity was prepped and draped in sterile fashion with the [facility] leg holder.  The leg was exsanguinated.  Tourniquet was elevated to 250 mmHg.  The patient's old incision was excised removing the hypertrophic scar.  I then sharply exposed the joint capsule removing any excessive and nonviable sub dermal soft tissues.  Medial arthrotomy was made encountering clear synovial fluid.  There is immediately evident hypertrophic scar in the parapatellar area, particularly inferior aspect.  At this point, the knee was exposed in routine fashion and synovectomy carried out sharply over the medial aspect of the joint, carried to the suprapatellar pouch and then along the extensor mechanism.  At this point, towel clips were used to evert the patella and sharp excisional debridement of the peripatellar fat and synovial and scar tissue were debrided.  This was taken down to the exposed tendon on both the quadriceps and patella size.  The lateral gutter was debrided as well of any excessive scarring and synovium.  Once this was complete, I examined her knee under anesthesia and found the medial and lateral collateral ligaments are stable.  The knee was in a neutral position.  There did not appear to be excessive tightness medially and laterally.  Her patella tracked through the trochlea without mechanical complication and normal without any application of pressure.  At this point, we irrigated the knee with 500 mL of normal saline solution with pulse lavage.  The knee was then flexed and extensor mechanism reapproximated using a combination of interrupted #1 Vicryl and a running #0 Stratafix suture.  The remainder of the wound was closed with 2-0 Vicryl and then a running 3-0 Monocryl.  The knee was cleaned, dried, and dressed sterilely using surgical glue and Aquacel dressing.  She was brought to the recovery room  in stable condition tolerating the procedure well.  I reviewed the findings with her family.  She will plan on discharge in the morning.     Madlyn Frankel Charlann Boxer, M.D.   ______________________________ Madlyn Frankel. Charlann Boxer, M.D.    MDO/MEDQ  D:  08/18/2016  T:  08/18/2016  Job:  [id number]

## 2016-08-19 NOTE — Evaluation (Signed)
Physical Therapy Evaluation Patient Details Name: Jessica Lam MRN: [id number] DOB: October 06, 1955 Today's Date: 08/19/2016   History of Present Illness  R knee scar debridement  Clinical Impression  Patient evaluated by Physical Therapy with no further acute PT needs identified. All education has been completed and the patient has no further questions. See below for any follow-up Physical Therapy or equipment needs. PT is signing off. Thank you for this referral.  Pt going to OP; reviewed HEP and advised pt to perform exercises again 1-2 more times today, she verbalizes understanding     Follow Up Recommendations Outpatient PT    Equipment Recommendations  Rolling walker with 5" wheels    Recommendations for Other Services       Precautions / Restrictions Precautions Precautions: Knee;Fall Restrictions Weight Bearing Restrictions: No RLE Weight Bearing: Weight bearing as tolerated      Mobility  Bed Mobility Overal bed mobility: Independent             General bed mobility comments: NT --pt in chair  Transfers Overall transfer level: Modified independent Equipment used: Rolling walker (2 wheeled) Transfers: Sit to/from Stand Sit to Stand: Modified independent (Device/Increase time) Stand pivot transfers: Supervision       General transfer comment: initial cues for hand placement and overall safety--pt mod I end of PT session  Ambulation/Gait Ambulation/Gait assistance: Supervision;Modified independent (Device/Increase time) Ambulation Distance (Feet): 120 Feet Assistive device: Rolling walker (2 wheeled) Gait Pattern/deviations: Step-to pattern;Step-through pattern     General Gait Details: initial cues for RW position  Stairs            Wheelchair Mobility    Modified Rankin (Stroke Patients Only)       Balance Overall balance assessment: No apparent balance deficits (not formally assessed) (denies falls)                                            Pertinent Vitals/Pain Pain Assessment: 0-10 Pain Score: 1  Pain Location: R Knee Pain Descriptors / Indicators: Sore Pain Intervention(s): Limited activity within patient's tolerance;Monitored during session;Premedicated before session;Ice applied    Home Living Family/patient expects to be discharged to:: Private residence Living Arrangements: Other relatives (dtr adn son-in-law) Available Help at Discharge: Family;Available 24 hours/day Type of Home: House Home Access: Stairs to enter Entrance Stairs-Rails: None Entrance Stairs-Number of Steps: 1 step through garage Home Layout: Two level;Able to live on main level with bedroom/bathroom Home Equipment: Kasandra Knudsen - single point;Walker - 4 wheels;Grab bars - tub/shower;Hand held shower head Additional Comments: pt plans to stay downstairs for a few days    Prior Function Level of Independence: Independent with assistive device(s)         Comments: used cane prn     Hand Dominance   Dominant Hand: Right    Extremity/Trunk Assessment   Upper Extremity Assessment Upper Extremity Assessment: Overall WFL for tasks assessed    Lower Extremity Assessment Lower Extremity Assessment: RLE deficits/detail RLE Deficits / Details: strength grossly 3 to 3+/5; AAROM grossly -5 to 75* knee flexion       Communication   Communication: No difficulties  Cognition Arousal/Alertness: Awake/alert Behavior During Therapy: WFL for tasks assessed/performed Overall Cognitive Status: Within Functional Limits for tasks assessed                      General Comments  Exercises Total Joint Exercises Ankle Circles/Pumps: AROM;10 reps;Both Quad Sets: 10 reps;AROM;Both Heel Slides: AAROM;AROM;Right;10 reps Hip ABduction/ADduction: AROM;Right;10 reps Straight Leg Raises: AROM;Both;10 reps;Strengthening   Assessment/Plan    PT Assessment All further PT needs can be met in the next venue of care  PT Problem  List            PT Treatment Interventions      PT Goals (Current goals can be found in the Care Plan section)  Acute Rehab PT Goals Patient Stated Goal: Go home today, get knee movement back PT Goal Formulation: All assessment and education complete, DC therapy (to D/C today)    Frequency     Barriers to discharge        Co-evaluation               End of Session Equipment Utilized During Treatment: Gait belt Activity Tolerance: Patient tolerated treatment well Patient left: in chair;with call bell/phone within reach;with chair alarm set      Functional Assessment Tool Used: clinical judgement Functional Limitation: Mobility: Walking and moving around Mobility: Walking and Moving Around Current Status (V4944): At least 1 percent but less than 20 percent impaired, limited or restricted Mobility: Walking and Moving Around Goal Status [phone]): At least 1 percent but less than 20 percent impaired, limited or restricted Mobility: Walking and Moving Around Discharge Status [phone]): 0 percent impaired, limited or restricted    Time: 0850-0915 PT Time Calculation (min) (ACUTE ONLY): 25 min   Charges:   PT Evaluation $PT Eval Low Complexity: 1 Procedure PT Treatments $Therapeutic Exercise: 8-22 mins   PT G Codes:   PT G-Codes **NOT FOR INPATIENT CLASS** Functional Assessment Tool Used: clinical judgement Functional Limitation: Mobility: Walking and moving around Mobility: Walking and Moving Around Current Status (Y6599): At least 1 percent but less than 20 percent impaired, limited or restricted Mobility: Walking and Moving Around Goal Status [phone]): At least 1 percent but less than 20 percent impaired, limited or restricted Mobility: Walking and Moving Around Discharge Status [phone]): 0 percent impaired, limited or restricted    [facility] 08/19/2016, 11:20 AM

## 2016-08-19 NOTE — Evaluation (Signed)
Occupational Therapy Evaluation Patient Details Name: Gray Bernhardtammy W Eisenmenger MRN: [id number] DOB: [birth year] Today's Date: 08/19/2016    History of Present Illness R TKA revision/scar debridement   Clinical Impression   Pt admitted as above, currently overall Mod I - supervision assist for functional mobility, ADL's (Min guard for simulated tub transfer). She was Mod I sitting to don/doff socks while seated, w/o a/e. Pt plans to d/c home later today, w/ PRN family assist, she denies any further acute OT needs at this time. Will sign off.    Follow Up Recommendations  No OT follow up;Supervision - Intermittent    Equipment Recommendations  None recommended by OT    Recommendations for Other Services       Precautions / Restrictions Precautions Precautions: Fall;Other (comment) (Knee) Restrictions Weight Bearing Restrictions: Yes RLE Weight Bearing: Weight bearing as tolerated      Mobility Bed Mobility Overal bed mobility: Independent             General bed mobility comments: Pt gets in/out of bed Independently  Transfers Overall transfer level: Needs assistance Equipment used: Rolling walker (2 wheeled) Transfers: Sit to/from BJ'sStand;Stand Pivot Transfers Sit to Stand: Modified independent (Device/Increase time) Stand pivot transfers: Supervision       General transfer comment: Pt asking when she can use a cane vs RW    Balance Overall balance assessment: No apparent balance deficits (not formally assessed)                                          ADL Overall ADL's : Needs assistance/impaired Eating/Feeding: Independent;Bed level;Sitting   Grooming: Wash/dry hands;Wash/dry face;Sitting;Set up   Upper Body Bathing: Set up;Sitting   Lower Body Bathing: Set up;Sit to/from stand;Supervison/ safety   Upper Body Dressing : Set up;Sitting   Lower Body Dressing: Modified independent;Supervision/safety;Sit to/from stand Lower Body Dressing Details  (indicate cue type and reason): Pt was able to don/doff socks bilateral LE's in sitting  Toilet Transfer: Supervision/safety;RW;Ambulation;Regular Toilet   Toileting- Clothing Manipulation and Hygiene: Modified independent;Sitting/lateral lean   Tub/ Shower Transfer: Tub transfer;Min guard;Ambulation;Rolling walker (Simulated step up) Tub/Shower Transfer Details (indicate cue type and reason): Has grab bars in tub at home, reports that family will assist PRN at d/c. Pt denies concerns at this time Functional mobility during ADLs: Supervision/safety;Rolling walker General ADL Comments: Pt was assessed followed by ADL training session to include ADL's, functional mobility in room, LB dressing techniques and transfers. She will have PRN assistance at d/c from family and plans to d/c home later today. Denies any further acute OT needs at this time.     Vision  Wears glasses (bifocals) PRN, No change from baseline.   Perception     Praxis      Pertinent Vitals/Pain Pain Assessment: 0-10 Pain Score: 2  Pain Location: R Knee Pain Descriptors / Indicators: Sore Pain Intervention(s): Limited activity within patient's tolerance;Repositioned;Ice applied;Premedicated before session;Monitored during session     Hand Dominance Right   Extremity/Trunk Assessment Upper Extremity Assessment Upper Extremity Assessment: Overall WFL for tasks assessed   Lower Extremity Assessment Lower Extremity Assessment: Defer to PT evaluation       Communication Communication Communication: No difficulties   Cognition Arousal/Alertness: Awake/alert Behavior During Therapy: WFL for tasks assessed/performed Overall Cognitive Status: Within Functional Limits for tasks assessed  General Comments       Exercises       Shoulder Instructions      Home Living Family/patient expects to be discharged to:: Private residence Living Arrangements: Other relatives Available Help at  Discharge: Family;Available 24 hours/day Type of Home: House Home Access: Stairs to enter [employer] of Steps: 1 step through garage Entrance Stairs-Rails: None Home Layout: Two level;Able to live on main level with bedroom/bathroom Alternate Level Stairs-Number of Steps: 13   Bathroom Shower/Tub: Tub/shower unit Shower/tub characteristics: Engineer, building services: Standard     Home Equipment: Cane - single point;Walker - 4 wheels;Grab bars - tub/shower;Hand held shower head (Has 4 wheeled walker but breaks "Don't work")          Prior Functioning/Environment Level of Independence: Independent with assistive device(s)        Comments: used cane prn        OT Problem List:     OT Treatment/Interventions:      OT Goals(Current goals can be found in the care plan section) Acute Rehab OT Goals Patient Stated Goal: Go home today  OT Frequency:     Barriers to D/C:            Co-evaluation              End of Session Equipment Utilized During Treatment: Rolling walker  Activity Tolerance: Patient tolerated treatment well;No increased pain Patient left: in chair;with call bell/phone within reach   Time: 0755-0820 OT Time Calculation (min): 25 min Charges:  OT General Charges $OT Visit: 1 Procedure OT Evaluation $OT Eval Low Complexity: 1 Procedure OT Treatments $Self Care/Home Management : 8-22 mins G-Codes: OT G-codes **NOT FOR INPATIENT CLASS** Functional Assessment Tool Used: Clinical judgement Functional Limitation: Self care Self Care Current Status (Z6109): At least 1 percent but less than 20 percent impaired, limited or restricted Self Care Goal Status (U0454): At least 1 percent but less than 20 percent impaired, limited or restricted Self Care Discharge Status [phone]): At least 1 percent but less than 20 percent impaired, limited or restricted  Maleigh, Bagot, OTR/L 08/19/2016, 8:33 AM

## 2016-08-19 NOTE — Care Management Note (Signed)
Case Management Note  Patient Details  Name: Jessica Lam MRN: [id number] Date of Birth: 03/20/1956  Subjective/Objective:                  SCAR DEBRIDEMENT OF RIGHT TOTAL KNEE (Right) Action/Plan: Discharge planning Expected Discharge Date:  08/19/16               Expected Discharge Plan:  Home/Self Care  In-House Referral:     Discharge planning Services  CM Consult  Post Acute Care Choice:  Durable Medical Equipment Choice offered to:  Patient  DME Arranged:  Walker rolling DME Agency:  [facility]  HH Arranged:  NA HH Agency:  NA  Status of Service:  Completed, signed off  If discussed at Long Length of Stay Meetings, dates discussed:    Additional Comments: CM met with pt in room to assess for home needs and pt states she is going to outpt PT starting tomorrow. Pt requests rolling walker and CM notified [facility] rep, Kimberly to please deliver the rolling walker to room prior to discharge. No other CM needs were communicated Jeffries, Sarah Christine, RN 08/19/2016, 9:38 AM  

## 2016-08-22 NOTE — Discharge Summary (Signed)
Physician Discharge Summary  Patient ID: Jessica Lam MRN: [id number] DOB/AGE: 59-Sep-1957 59 y.o.  Admit date: 08/18/2016 Discharge date: 08/19/2016   Procedures:  Procedure(s) (LRB): SCAR DEBRIDEMENT OF RIGHT TOTAL KNEE (Right)  Attending Physician:  Dr. 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 Arthritis    DDD -back and srthritis right knee  . COPD (chronic obstructive pulmonary disease) (HCC)   . Depression   . Gas    lots of gas and bloating issues  . GERD (gastroesophageal reflux disease)   . Hypertension    med was discontinued over a yr-"Blood pressure too low"  . Hypothyroidism   . PONV (postoperative nausea and vomiting)   . Sleep apnea    no cpap used    HPI:    Pt is a 61 y.o. female complaining of stiffness, crepitus and pain since the TKA. Pain had continually increased since the beginning. X-rays in the clinic show a right TKA. Pt has tried various conservative treatments which have failed to alleviate their symptoms, including analgesic medications.  Patient previously stopped PT after the TKA, I have stressed the importance of PT and getting better after the next surgery.   Various options are discussed with the patient. Risks, benefits and expectations were discussed with the patient. Patient understand the risks, benefits and expectations and wishes to proceed with surgery.   PCP: Dema Severin, NP   Discharged Condition: good  Hospital Course:  Patient underwent the above stated procedure on 08/18/2016. Patient tolerated the procedure well and brought to the recovery room in good condition and subsequently to the floor.  POD #1 BP: 119/55 ; Pulse: 62 ; Temp: 98 F (36.7 C) ; Resp: 16 Patient reports pain as mild.  Has done well.  Ready to progress, has therapy set up  already. Dorsiflexion/plantar flexion intact, incision: dressing C/D/I, no cellulitis present and compartment soft.   LABS  Basename    HGB     12.9  HCT     37.2    Discharge Exam: General appearance: alert, cooperative and no distress Extremities: Homans sign is negative, no sign of DVT, no edema, redness or tenderness in the calves or thighs and no ulcers, gangrene or trophic changes  Disposition: Home with follow up in 2 weeks   Follow-up Information    Shelda Pal, MD. Schedule an appointment as soon as possible for a visit in 2 week(s).   Specialty:  Orthopedic Surgery Contact information: [address] [place] [address] [phone]        [facility] Follow up.   Why:  rolling walker Contact information: [address] [place] [address] [phone]           Discharge Instructions    Call MD / Call 911    Complete by:  As directed    If you experience chest pain or shortness of breath, CALL 911 and be transported to the hospital emergency room.  If you develope a fever above 101 F, pus (white drainage) or increased drainage or redness at the wound, or calf pain, call your surgeon's office.   Change dressing    Complete by:  As directed    Maintain surgical dressing until follow up in the clinic. If the edges start to pull  up, may reinforce with tape. If the dressing is no longer working, may remove and cover with gauze and tape, but must keep the area dry and clean.  Call with any questions or concerns.   Constipation Prevention    Complete by:  As directed    Drink plenty of fluids.  Prune juice may be helpful.  You may use a stool softener, such as Colace (over the counter) 100 mg twice a day.  Use MiraLax (over the counter) for constipation as needed.   Diet - low sodium heart healthy    Complete by:  As directed    Discharge instructions    Complete by:  As directed    Maintain surgical dressing until  follow up in the clinic. If the edges start to pull up, may reinforce with tape. If the dressing is no longer working, may remove and cover with gauze and tape, but must keep the area dry and clean.  Follow up in 2 weeks at [facility]. Call with any questions or concerns.   Increase activity slowly as tolerated    Complete by:  As directed    Weight bearing as tolerated with assist device (walker, cane, etc) as directed, use it as long as suggested by your surgeon or therapist, typically at least 4-6 weeks.   TED hose    Complete by:  As directed    Use stockings (TED hose) for 2 weeks on both leg(s).  You may remove them at night for sleeping.      Allergies as of 08/19/2016      Reactions   Codeine Nausea And Vomiting   Shellfish Allergy Swelling   Swelling of the tongue    Sulfa Antibiotics Other (See Comments)   Pt can not remember - just knows she got very sick   Penicillins Rash   Has patient had a PCN reaction causing immediate rash, facial/tongue/throat swelling, SOB or lightheadedness with hypotension: Unknown Has patient had a PCN reaction causing severe rash involving mucus membranes or skin necrosis: Unknown Has patient had a PCN reaction that required hospitalization: Unknown Has patient had a PCN reaction occurring within the last 10 years: No  If all of the above answers are "NO", then may proceed with Cephalosporin use.      Medication List    STOP taking these medications   ibuprofen 800 MG tablet Commonly known as:  ADVIL,MOTRIN     TAKE these medications   ADVAIR DISKUS 250-50 MCG/DOSE Aepb Generic drug:  Fluticasone-Salmeterol Take 1 puff by mouth daily as needed (for shortness of breath).   albuterol 108 (90 Base) MCG/ACT inhaler Commonly known as:  PROVENTIL HFA;VENTOLIN HFA Inhale 1-2 puffs into the lungs every 6 (six) hours as needed for wheezing or shortness of breath.   albuterol (2.5 MG/3ML) 0.083% nebulizer solution Commonly known as:   PROVENTIL Take 2.5 mg by nebulization every 6 (six) hours as needed for wheezing or shortness of breath.   aspirin 81 MG chewable tablet Chew 1 tablet (81 mg total) by mouth 2 (two) times daily. Take for 4 weeks, then resume regular dose. What changed:  when to take this  additional instructions   docusate sodium 100 MG capsule Commonly known as:  COLACE Take 1 capsule (100 mg total) by mouth 2 (two) times daily.   esomeprazole 40 MG capsule Commonly known as:  NEXIUM Take 40 mg by mouth at bedtime.   ferrous sulfate 325 (65 FE) MG tablet Commonly known as:  FERROUSUL Take 1 tablet (325 mg total) by mouth 3 (three) times daily with meals.   HYDROcodone-acetaminophen 10-325 MG tablet Commonly known as:  NORCO Take 1-2 tablets by mouth every 4 (four) hours as needed.   levothyroxine 25 MCG tablet Commonly known as:  SYNTHROID, LEVOTHROID Take 25 mcg by mouth at bedtime.   methocarbamol 500 MG tablet Commonly known as:  ROBAXIN Take 1 tablet (500 mg total) by mouth every 6 (six) hours as needed for muscle spasms.   PERFOROMIST 20 MCG/2ML nebulizer solution Generic drug:  formoterol Take 20 mcg by nebulization daily as needed (for shortness of breath).   polyethylene glycol packet Commonly known as:  MIRALAX / GLYCOLAX Take 17 g by mouth 2 (two) times daily.   rosuvastatin 40 MG tablet Commonly known as:  CRESTOR Take 40 mg by mouth at bedtime.   sertraline 100 MG tablet Commonly known as:  ZOLOFT Take 100 mg by mouth at bedtime.        Signed: Anastasio AuerbachMatthew S. Eriverto Byrnes   PA-C  08/22/2016, 7:51 AM

## 2017-03-05 NOTE — Addendum Note (Signed)
Addendum  created 03/05/17 1041 by Ethelle Ola, MD   Sign clinical note    

## 2019-09-29 ENCOUNTER — Encounter: Payer: Self-pay | Admitting: Critical Care Medicine

## 2019-09-29 ENCOUNTER — Ambulatory Visit (INDEPENDENT_AMBULATORY_CARE_PROVIDER_SITE_OTHER): Payer: Medicare Other | Admitting: Critical Care Medicine

## 2019-09-29 ENCOUNTER — Other Ambulatory Visit: Payer: Self-pay

## 2019-09-29 VITALS — BP 168/90 | HR 68 | Temp 97.6°F | Ht 62.0 in | Wt 197.0 lb

## 2019-09-29 DIAGNOSIS — Z720 Tobacco use: Secondary | ICD-10-CM | POA: Diagnosis not present

## 2019-09-29 DIAGNOSIS — R911 Solitary pulmonary nodule: Secondary | ICD-10-CM | POA: Diagnosis not present

## 2019-09-29 DIAGNOSIS — J449 Chronic obstructive pulmonary disease, unspecified: Secondary | ICD-10-CM

## 2019-09-29 MED ORDER — VARENICLINE TARTRATE 0.5 MG X 11 & 1 MG X 42 PO MISC: ORAL | 0 refills | Status: DC

## 2019-09-29 MED ORDER — SPIRIVA RESPIMAT 2.5 MCG/ACT IN AERS: 1.0000 | INHALATION_SPRAY | Freq: Every day | RESPIRATORY_TRACT | 11 refills | Status: DC

## 2019-09-29 MED ORDER — VARENICLINE TARTRATE 1 MG PO TABS: 1.0000 mg | ORAL_TABLET | Freq: Two times a day (BID) | ORAL | 3 refills | Status: DC

## 2019-09-29 NOTE — Progress Notes (Signed)
Synopsis: Referred in March 2021 for pulmonary nodules by Dema Severin, NP.  Subjective:   PATIENT ID: Jessica Bernhardt GENDER: female DOB: [birth year], MRN: [id number]  Chief Complaint  Patient presents with  . Consult    Patient is here for shortness of breath all the time and nodules on lung.     Jessica Lam is a 64 year old woman with a history of tobacco abuse and COPD who presents for evaluation of pulmonary nodules.  She is accompanied by her Sister Jessica Lam.  She recently had a follow-up chest CT that demonstrated a new 5 mm nodule.  She is very concerned given her family history of lung cancer in her mother.  She was diagnosed with COPD several years ago.  She uses Advair 2 50-50.  She has wheezing, shortness of breath, cough, and significant sputum production.  She cannot walk a full block outside, but pain in her legs limits her activity more than her shortness of breath.  She uses albuterol about once per week.  She reports that she had PFTs done last year [facility].  She is an every day smoker, 1 pack/day.  She has smoked since the age of 80 (78 years), and smoked 2.5 packs/day at her heaviest.  She wants to quit, but has been unsuccessful using Chantix and nicotine replacement therapy in the past.  She wants to try Chantix again.  Nicotine replacement therapy gave her nausea.  She has had her flu shot and pneumococcal 23 vaccine.      Past Medical History:  Diagnosis Date  . Arthritis    DDD -back and srthritis right knee  . COPD (chronic obstructive pulmonary disease) (HCC)   . Depression   . Gas    lots of gas and bloating issues  . GERD (gastroesophageal reflux disease)   . H. pylori infection   . Hypertension    med was discontinued over a yr-"Blood pressure too low"  . Hypothyroidism   . PONV (postoperative nausea and vomiting)   . Sleep apnea    no cpap used     Family History  Problem Relation Age of Onset  . Lung cancer Mother   . Heart attack Father   .  Hepatitis C Brother   . Hepatitis C Brother      Past Surgical History:  Procedure Laterality Date  . ABDOMINAL HYSTERECTOMY     Abdominal  . APPENDECTOMY    . CHOLECYSTECTOMY    . COLONOSCOPY    . COLONOSCOPY WITH ESOPHAGOGASTRODUODENOSCOPY (EGD)    . DIAGNOSTIC LAPAROSCOPY     "scar tissue"  . JOINT REPLACEMENT     RTKA  . LEG SURGERY Right    ORIF with plates and screws and removal, x2 surgeries I & D "infection"  . LSO Left    left Salpingo oophorectomy  . SCAR DEBRIDEMENT OF TOTAL KNEE Right 08/18/2016   Procedure: SCAR DEBRIDEMENT OF RIGHT TOTAL KNEE;  Surgeon: Durene Romans, MD;  Location: [facility];  Service: Orthopedics;  Laterality: Right;  . TONSILLECTOMY    . TOTAL KNEE REVISION Right 02/18/2016   Procedure: RIGHT TOTAL KNEE REVISION WITH SAPHENOUS NERVE EXCISION;  Surgeon: Durene Romans, MD;  Location: [facility];  Service: Orthopedics;  Laterality: Right;  . TUBAL LIGATION      Social History   Socioeconomic History  . Marital status: Legally Separated    Spouse name: Not on file  . Number of children: Not on file  . Years of education: Not on  file  . Highest education level: Not on file  Occupational History  . Not on file  Tobacco Use  . Smoking status: Current Every Day Smoker    Packs/day: 1.00    Years: 52.00    Pack years: 52.00    Types: Cigarettes  . Smokeless tobacco: Never Used  Substance and Sexual Activity  . Alcohol use: No  . Drug use: No  . Sexual activity: Not on file  Other Topics Concern  . Not on file  Social History Narrative  . Not on file   Social Determinants of Health   Financial Resource Strain:   . Difficulty of Paying Living Expenses:   Food Insecurity:   . Worried About Charity fundraiser in the Last Year:   . Arboriculturist in the Last Year:   Transportation Needs:   . Film/video editor (Medical):   Marland Kitchen Lack of Transportation (Non-Medical):   Physical Activity:   . Days of Exercise per Week:   . Minutes of Exercise  per Session:   Stress:   . Feeling of Stress :   Social Connections:   . Frequency of Communication with Friends and Family:   . Frequency of Social Gatherings with Friends and Family:   . Attends Religious Services:   . Active Member of Clubs or Organizations:   . Attends Archivist Meetings:   Marland Kitchen Marital Status:   Intimate Partner Violence:   . Fear of Current or Ex-Partner:   . Emotionally Abused:   Marland Kitchen Physically Abused:   . Sexually Abused:      Allergies  Allergen Reactions  . Shellfish-Derived Products Swelling  . Codeine Nausea And Vomiting  . Shellfish Allergy Swelling    Swelling of the tongue   . Sulfa Antibiotics Other (See Comments)    Pt can not remember - just knows she got very sick  . Penicillins Rash    Has patient had a PCN reaction causing immediate rash, facial/tongue/throat swelling, SOB or lightheadedness with hypotension: Unknown Has patient had a PCN reaction causing severe rash involving mucus membranes or skin necrosis: Unknown Has patient had a PCN reaction that required hospitalization: Unknown Has patient had a PCN reaction occurring within the last 10 years: No  If all of the above answers are "NO", then may proceed with Cephalosporin use.      Immunization History  Administered Date(s) Administered  . Influenza,inj,Quad PF,6-35 Mos 04/26/2019    Outpatient Medications Prior to Visit  Medication Sig Dispense Refill  . ADVAIR DISKUS 250-50 MCG/DOSE AEPB Take 1 puff by mouth daily as needed (for shortness of breath).     Marland Kitchen albuterol (PROVENTIL HFA;VENTOLIN HFA) 108 (90 Base) MCG/ACT inhaler Inhale 1-2 puffs into the lungs every 6 (six) hours as needed for wheezing or shortness of breath.    . docusate sodium (COLACE) 100 MG capsule Take 1 capsule (100 mg total) by mouth 2 (two) times daily. 10 capsule 0  . esomeprazole (NEXIUM) 40 MG capsule Take 40 mg by mouth at bedtime.    . ferrous sulfate (FERROUSUL) 325 (65 FE) MG tablet Take 1  tablet (325 mg total) by mouth 3 (three) times daily with meals.    Marland Kitchen HYDROcodone-acetaminophen (NORCO) 10-325 MG tablet Take 1-2 tablets by mouth every 4 (four) hours as needed. 100 tablet 0  . levothyroxine (SYNTHROID, LEVOTHROID) 25 MCG tablet Take 25 mcg by mouth at bedtime.    . methocarbamol (ROBAXIN) 500 MG tablet Take 1  tablet (500 mg total) by mouth every 6 (six) hours as needed for muscle spasms. 40 tablet 0  . polyethylene glycol (MIRALAX / GLYCOLAX) packet Take 17 g by mouth 2 (two) times daily. 14 each 0  . rosuvastatin (CRESTOR) 40 MG tablet Take 40 mg by mouth at bedtime.    . sertraline (ZOLOFT) 100 MG tablet Take 100 mg by mouth at bedtime.    Marland Kitchen albuterol (PROVENTIL) (2.5 MG/3ML) 0.083% nebulizer solution Take 2.5 mg by nebulization every 6 (six) hours as needed for wheezing or shortness of breath.    . formoterol (PERFOROMIST) 20 MCG/2ML nebulizer solution Take 20 mcg by nebulization daily as needed (for shortness of breath).     No facility-administered medications prior to visit.    ROS   Objective:   Vitals:   09/29/19 1407  BP: (!) 168/90  Pulse: 68  Temp: 97.6 F (36.4 C)  TempSrc: Temporal  SpO2: 99%  Weight: 197 lb (89.4 kg)  Height: 5\' 2"  (1.575 m)   99% on   RA BMI Readings from Last 3 Encounters:  09/29/19 36.03 kg/m  08/18/16 34.02 kg/m  08/11/16 34.02 kg/m   Wt Readings from Last 3 Encounters:  09/29/19 197 lb (89.4 kg)  08/18/16 189 lb (85.7 kg)  08/11/16 189 lb (85.7 kg)    Physical Exam Vitals reviewed.  Constitutional:      General: She is not in acute distress.    Appearance: She is obese. She is not ill-appearing.  HENT:     Head: Normocephalic and atraumatic.  Eyes:     General: No scleral icterus. Cardiovascular:     Rate and Rhythm: Normal rate and regular rhythm.     Heart sounds: No murmur.  Pulmonary:     Comments: Breathing comfortably on room air, no conversational dyspnea.  Clear to auscultation  bilaterally. Abdominal:     General: There is no distension.     Palpations: Abdomen is soft.     Tenderness: There is no abdominal tenderness.  Musculoskeletal:        General: No swelling or deformity.     Cervical back: Neck supple.  Lymphadenopathy:     Cervical: No cervical adenopathy.  Skin:    General: Skin is warm and dry.     Findings: No rash.  Neurological:     Mental Status: She is alert.     Motor: No weakness.     Coordination: Coordination normal.  Psychiatric:        Mood and Affect: Mood normal.        Behavior: Behavior normal.      CBC    Component Value Date/Time   WBC 11.2 (H) 08/19/2016 0437   RBC 4.11 08/19/2016 0437   HGB 12.9 08/19/2016 0437   HCT 37.2 08/19/2016 0437   PLT 196 08/19/2016 0437   MCV 90.5 08/19/2016 0437   MCH 31.4 08/19/2016 0437   MCHC 34.7 08/19/2016 0437   RDW 13.6 08/19/2016 0437   LYMPHSABS 2.5 03/26/2010 0525   MONOABS 0.8 03/26/2010 0525   EOSABS 0.2 03/26/2010 0525   BASOSABS 0.1 03/26/2010 0525    CHEMISTRY No results for input(s): NA, K, CL, CO2, GLUCOSE, BUN, CREATININE, CALCIUM, MG, PHOS in the last 168 hours. CrCl cannot be calculated (Patient's most recent lab result is older than the maximum 21 days allowed.).   Chest Imaging- films reviewed: CXR 2 view 08/08/2008- increased retrosternal airspace suggesting hyperinflation  CT chest 09/05/2019 performed at [facility] health-report only  available.  No significant adenopathy.  5 mm subpleural medial LLL nodule new since May 2020.  Multiple other pulmonary nodules 3 mm or less, all unchanged.  Persistent but smaller bilateral adrenal nodules.  Pulmonary Functions Testing Results: No flowsheet data found.      Assessment & Plan:     ICD-10-CM   1. Lung nodule  R91.1 Pulmonary function test    CT Chest Wo Contrast  2. Chronic obstructive pulmonary disease, unspecified COPD type (HCC)  J44.9 Pulmonary function test    CT Chest Wo Contrast  3. Tobacco abuse   Z72.0 Pulmonary function test    CT Chest Wo Contrast     5 mm pulmonary nodule in a high risk patient given positive family history for lung cancer, ongoing tobacco abuse -21-month follow-up CT scan -Strongly recommend tobacco cessation  COPD; previous PFTs unavailable -Stop Spiriva, start Stiolto once daily -Continue albuterol as needed -Strongly recommend tobacco cessation -PFTs with CT in August 2021. -Prevnar 13 today.  Up-to-date on flu and pneumococcal 23 vaccines.  Recommend Covid vaccine when it is available to her.  Ongoing tobacco abuse -Strongly recommend cessation.  She seems motivated to do this.  Recommend trial of Chantix.  Discussed potential side effects and that she should stop it immediately if she develops these.  RTC in 3 months.    Current Outpatient Medications:  .  ADVAIR DISKUS 250-50 MCG/DOSE AEPB, Take 1 puff by mouth daily as needed (for shortness of breath). , Disp: , Rfl:  .  albuterol (PROVENTIL HFA;VENTOLIN HFA) 108 (90 Base) MCG/ACT inhaler, Inhale 1-2 puffs into the lungs every 6 (six) hours as needed for wheezing or shortness of breath., Disp: , Rfl:  .  docusate sodium (COLACE) 100 MG capsule, Take 1 capsule (100 mg total) by mouth 2 (two) times daily., Disp: 10 capsule, Rfl: 0 .  esomeprazole (NEXIUM) 40 MG capsule, Take 40 mg by mouth at bedtime., Disp: , Rfl:  .  ferrous sulfate (FERROUSUL) 325 (65 FE) MG tablet, Take 1 tablet (325 mg total) by mouth 3 (three) times daily with meals., Disp: , Rfl:  .  HYDROcodone-acetaminophen (NORCO) 10-325 MG tablet, Take 1-2 tablets by mouth every 4 (four) hours as needed., Disp: 100 tablet, Rfl: 0 .  levothyroxine (SYNTHROID, LEVOTHROID) 25 MCG tablet, Take 25 mcg by mouth at bedtime., Disp: , Rfl:  .  methocarbamol (ROBAXIN) 500 MG tablet, Take 1 tablet (500 mg total) by mouth every 6 (six) hours as needed for muscle spasms., Disp: 40 tablet, Rfl: 0 .  polyethylene glycol (MIRALAX / GLYCOLAX) packet, Take 17  g by mouth 2 (two) times daily., Disp: 14 each, Rfl: 0 .  rosuvastatin (CRESTOR) 40 MG tablet, Take 40 mg by mouth at bedtime., Disp: , Rfl:  .  sertraline (ZOLOFT) 100 MG tablet, Take 100 mg by mouth at bedtime., Disp: , Rfl:  .  albuterol (PROVENTIL) (2.5 MG/3ML) 0.083% nebulizer solution, Take 2.5 mg by nebulization every 6 (six) hours as needed for wheezing or shortness of breath., Disp: , Rfl:  .  formoterol (PERFOROMIST) 20 MCG/2ML nebulizer solution, Take 20 mcg by nebulization daily as needed (for shortness of breath)., Disp: , Rfl:  .  Tiotropium Bromide Monohydrate (SPIRIVA RESPIMAT) 2.5 MCG/ACT AERS, Inhale 1 puff into the lungs daily., Disp: 4 g, Rfl: 11 .  varenicline (CHANTIX CONTINUING MONTH PAK) 1 MG tablet, Take 1 tablet (1 mg total) by mouth 2 (two) times daily., Disp: 60 tablet, Rfl: 3 .  varenicline (  CHANTIX PAK) 0.5 MG X 11 & 1 MG X 42 tablet, Take one 0.5 mg tablet by mouth once daily for 3 days, then increase to one 0.5 mg tablet twice daily for 4 days, then increase to one 1 mg tablet twice daily., Disp: 53 tablet, Rfl: 0    Steffanie DunnLaura P Laryah Neuser, DO [facility] 09/29/2019 3:19 PM

## 2019-09-29 NOTE — Patient Instructions (Addendum)
Thank you for visiting Dr. Chestine Spore at [facility]. We recommend the following: Orders Placed This Encounter  Procedures  . CT Chest Wo Contrast  . Pulmonary function test   Orders Placed This Encounter  Procedures  . CT Chest Wo Contrast    August 2021    Standing Status:   Future    Standing Expiration Date:   11/28/2020    Order Specific Question:   Preferred imaging location?    Answer:   [facility]    Order Specific Question:   Radiology Contrast Protocol - do NOT remove file path    Answer:   [email or web address]  . Pulmonary function test    August 2021    Standing Status:   Future    Standing Expiration Date:   09/28/2020    Order Specific Question:   Where should this test be performed?    Answer:   [facility]    Order Specific Question:   Full PFT: includes the following: basic spirometry, spirometry pre & post bronchodilator, diffusion capacity (DLCO), lung volumes    Answer:   Full PFT    Meds ordered this encounter  Medications  . varenicline (CHANTIX PAK) 0.5 MG X 11 & 1 MG X 42 tablet    Sig: Take one 0.5 mg tablet by mouth once daily for 3 days, then increase to one 0.5 mg tablet twice daily for 4 days, then increase to one 1 mg tablet twice daily.    Dispense:  53 tablet    Refill:  0  . varenicline (CHANTIX CONTINUING MONTH PAK) 1 MG tablet    Sig: Take 1 tablet (1 mg total) by mouth 2 (two) times daily.    Dispense:  60 tablet    Refill:  3  . Tiotropium Bromide Monohydrate (SPIRIVA RESPIMAT) 2.5 MCG/ACT AERS    Sig: Inhale 1 puff into the lungs daily.    Dispense:  4 g    Refill:  11    Return in about 2 months (around 11/29/2019).     Please do your part to reduce the spread of COVID-19.

## 2019-10-05 ENCOUNTER — Telehealth: Payer: Self-pay | Admitting: Critical Care Medicine

## 2019-10-05 NOTE — Telephone Encounter (Signed)
Spoke with patient. She stated that her insurance will not cover the Chantix or Spiriva. She has Medicaid and [employer]. Called [facility] and spoke with the pharmacist. She stated that since she Part D and Medicaid, Medicaid will not pay for the medications until Medicare has paid and most likely a PA is needed.   Will attempt the PAs for Medicare on Covermymeds.com  PA for Spiriva 2.62mcg Key is [id number]. Determination will be made in 1-3 days.   PA for Chantix Key is [id number]. Determination will be made in 1-3 days.

## 2019-10-06 NOTE — Telephone Encounter (Signed)
Medication name and strength: Spiriva 2.5 PA approved/denied: APPROVED Approval dates:  07/15/2019 - 10/04/2020  Medication name and strength: Chantix Starting Month Pak 0.5 MG X 11 &1 MG X 42 tablets PA approved/denied: APPROVED Approval dates:  07/15/2019 - 04/02/2020   Will call pharmacy and patient to notify them of approval.

## 2019-12-10 ENCOUNTER — Other Ambulatory Visit (HOSPITAL_COMMUNITY)
Admission: RE | Admit: 2019-12-10 | Discharge: 2019-12-10 | Disposition: A | Discharge status: Home / Self Care | Payer: Medicare Other | Source: Ambulatory Visit | Attending: Critical Care Medicine | Admitting: Critical Care Medicine

## 2019-12-10 DIAGNOSIS — Z20822 Contact with and (suspected) exposure to covid-19: Secondary | ICD-10-CM | POA: Insufficient documentation

## 2019-12-10 DIAGNOSIS — Z01812 Encounter for preprocedural laboratory examination: Secondary | ICD-10-CM | POA: Diagnosis present

## 2019-12-10 LAB — SARS CORONAVIRUS 2 (TAT 6-24 HRS): SARS Coronavirus 2: NEGATIVE

## 2019-12-14 ENCOUNTER — Encounter: Payer: Self-pay | Admitting: Critical Care Medicine

## 2019-12-14 ENCOUNTER — Ambulatory Visit (INDEPENDENT_AMBULATORY_CARE_PROVIDER_SITE_OTHER): Payer: Medicare Other | Admitting: Critical Care Medicine

## 2019-12-14 ENCOUNTER — Other Ambulatory Visit: Payer: Self-pay

## 2019-12-14 VITALS — BP 142/70 | HR 61 | Temp 98.0°F | Ht 62.5 in | Wt 194.0 lb

## 2019-12-14 DIAGNOSIS — Z720 Tobacco use: Secondary | ICD-10-CM

## 2019-12-14 DIAGNOSIS — R911 Solitary pulmonary nodule: Secondary | ICD-10-CM | POA: Diagnosis not present

## 2019-12-14 DIAGNOSIS — J449 Chronic obstructive pulmonary disease, unspecified: Secondary | ICD-10-CM

## 2019-12-14 LAB — PULMONARY FUNCTION TEST
DL/VA % pred: 71 %
DL/VA: 3.03 ml/min/mmHg/L
DLCO unc % pred: 73 %
DLCO unc: 14.09 ml/min/mmHg
FEF 25-75 Post: 1.55 L/sec
FEF 25-75 Pre: 1.06 L/sec
FEF2575-%Change-Post: 46 %
FEF2575-%Pred-Post: 69 %
FEF2575-%Pred-Pre: 47 %
FEV1-%Change-Post: 11 %
FEV1-%Pred-Post: 82 %
FEV1-%Pred-Pre: 73 %
FEV1-Post: 1.96 L
FEV1-Pre: 1.77 L
FEV1FVC-%Change-Post: 5 %
FEV1FVC-%Pred-Pre: 87 %
FEV6-%Change-Post: 6 %
FEV6-%Pred-Post: 90 %
FEV6-%Pred-Pre: 85 %
FEV6-Post: 2.71 L
FEV6-Pre: 2.55 L
FEV6FVC-%Change-Post: 1 %
FEV6FVC-%Pred-Post: 103 %
FEV6FVC-%Pred-Pre: 102 %
FVC-%Change-Post: 4 %
FVC-%Pred-Post: 87 %
FVC-%Pred-Pre: 83 %
FVC-Post: 2.71 L
FVC-Pre: 2.58 L
Post FEV1/FVC ratio: 72 %
Post FEV6/FVC ratio: 100 %
Pre FEV1/FVC ratio: 68 %
Pre FEV6/FVC Ratio: 99 %

## 2019-12-14 NOTE — Progress Notes (Signed)
PFT completed today.  

## 2019-12-14 NOTE — Patient Instructions (Addendum)
Thank you for visiting Dr. Chestine Spore at [facility]. We recommend the following:  Stay on Advair twice daily.  Try cutting back a few cigarettes per day to cut down.   Return in about 3 months (around 03/15/2020).    Please do your part to reduce the spread of COVID-19.  It is very important that you stop smoking or vaping. This is the single most important thing that you can do to improve your lung health.   S = Set a quit date. T = Tell family, friends, and the people around you that you plan to quit. A = Anticipate or plan ahead for the tough times you'll face while quitting. R = Remove cigarettes and other tobacco products from your home, car, and work. T = Talk to [place] about getting help to quit.  If you need help, please reach out to our office or the smoking cessation resources available: [facility] Smoking Cessation Class: [phone] 1-800-QUIT-NOW www.BeTobaccoFree.gov

## 2019-12-14 NOTE — Progress Notes (Signed)
Synopsis: Referred in March 2021 for pulmonary nodules by Imagene Riches, NP.  Subjective:   PATIENT ID: Jessica Lam GENDER: female DOB: 1956/05/13, MRN: [id number]  Chief Complaint  Patient presents with  . Follow-up    increased productive cough with thick brown mucus, follow up PFT     [name] is a 64 y/o woman with a history of COPD who presents for follow up.  She is accompanied today by her sister.  She brought a disk from her May 2020 and Feb 2021 CT scans from [place]. She has no change in her symptoms since her last visit. She had no improvement in her symptoms when starting Spiriva so she switched back to Advair twice daily.  Her cough is at baseline, she uses her albuterol few days per week, but not daily.  She continues to have a large volume of sputum.  She tried Chantix, but had frequent crying and felt depressed and subsequently stopped.  She continues to smoke 1 pack/day.  She is very nervous about her pulmonary nodules.    OV 09/29/19: Ms. Jasinski is a 64 year old woman with a history of tobacco abuse and COPD who presents for evaluation of pulmonary nodules.  She is accompanied by her Sister Maddie.  She recently had a follow-up chest CT that demonstrated a new 5 mm nodule.  She is very concerned given her family history of lung cancer in her mother.  She was diagnosed with COPD several years ago.  She uses Advair 2 50-50.  She has wheezing, shortness of breath, cough, and significant sputum production.  She cannot walk a full block outside, but pain in her legs limits her activity more than her shortness of breath.  She uses albuterol about once per week.  She reports that she had PFTs done last year [place].  She is an every day smoker, 1 pack/day.  She has smoked since the age of 86 (9 years), and smoked 2.5 packs/day at her heaviest.  She wants to quit, but has been unsuccessful using Chantix and nicotine replacement therapy in the past.  She wants to try Chantix again.   Nicotine replacement therapy gave her nausea.  She has had her flu shot and pneumococcal 23 vaccine.    Past Medical History:  Diagnosis Date  . Arthritis    DDD -back and srthritis right knee  . COPD (chronic obstructive pulmonary disease) ([place])   . Depression   . Gas    lots of gas and bloating issues  . GERD (gastroesophageal reflux disease)   . H. pylori infection   . Hypertension    med was discontinued over a yr-"Blood pressure too low"  . Hypothyroidism   . PONV (postoperative nausea and vomiting)   . Sleep apnea    no cpap used     Family History  Problem Relation Age of Onset  . Lung cancer Mother   . Heart attack Father   . Hepatitis C Brother   . Hepatitis C Brother      Past Surgical History:  Procedure Laterality Date  . ABDOMINAL HYSTERECTOMY     Abdominal  . APPENDECTOMY    . CHOLECYSTECTOMY    . COLONOSCOPY    . COLONOSCOPY WITH ESOPHAGOGASTRODUODENOSCOPY (EGD)    . DIAGNOSTIC LAPAROSCOPY     "scar tissue"  . JOINT REPLACEMENT     RTKA  . LEG SURGERY Right    ORIF with plates and screws and removal, x2 surgeries I & D "  infection"  . LSO Left    left Salpingo oophorectomy  . SCAR DEBRIDEMENT OF TOTAL KNEE Right 08/18/2016   Procedure: SCAR DEBRIDEMENT OF RIGHT TOTAL KNEE;  Surgeon: Durene Romans, MD;  Location: [facility];  Service: Orthopedics;  Laterality: Right;  . TONSILLECTOMY    . TOTAL KNEE REVISION Right 02/18/2016   Procedure: RIGHT TOTAL KNEE REVISION WITH SAPHENOUS NERVE EXCISION;  Surgeon: Durene Romans, MD;  Location: [facility];  Service: Orthopedics;  Laterality: Right;  . TUBAL LIGATION      Social History   Socioeconomic History  . Marital status: Legally Separated    Spouse name: Not on file  . Number of children: Not on file  . Years of education: Not on file  . Highest education level: Not on file  Occupational History  . Not on file  Tobacco Use  . Smoking status: Current Every Day Smoker    Packs/day: 1.00    Years: 52.00     Pack years: 52.00    Types: Cigarettes  . Smokeless tobacco: Never Used  . Tobacco comment: 20 cigarettes daily 12/14/19 [facility]   Substance and Sexual Activity  . Alcohol use: No  . Drug use: No  . Sexual activity: Not on file  Other Topics Concern  . Not on file  Social History Narrative  . Not on file   Social Determinants of Health   Financial Resource Strain:   . Difficulty of Paying Living Expenses:   Food Insecurity:   . Worried About Programme researcher, broadcasting/film/video in the Last Year:   . Barista in the Last Year:   Transportation Needs:   . Freight forwarder (Medical):   Marland Kitchen Lack of Transportation (Non-Medical):   Physical Activity:   . Days of Exercise per Week:   . Minutes of Exercise per Session:   Stress:   . Feeling of Stress :   Social Connections:   . Frequency of Communication with Friends and Family:   . Frequency of Social Gatherings with Friends and Family:   . Attends Religious Services:   . Active Member of Clubs or Organizations:   . Attends Banker Meetings:   Marland Kitchen Marital Status:   Intimate Partner Violence:   . Fear of Current or Ex-Partner:   . Emotionally Abused:   Marland Kitchen Physically Abused:   . Sexually Abused:      Allergies  Allergen Reactions  . Shellfish-Derived Products Swelling  . Codeine Nausea And Vomiting  . Shellfish Allergy Swelling    Swelling of the tongue   . Sulfa Antibiotics Other (See Comments)    Pt can not remember - just knows she got very sick  . Penicillins Rash    Has patient had a PCN reaction causing immediate rash, facial/tongue/throat swelling, SOB or lightheadedness with hypotension: Unknown Has patient had a PCN reaction causing severe rash involving mucus membranes or skin necrosis: Unknown Has patient had a PCN reaction that required hospitalization: Unknown Has patient had a PCN reaction occurring within the last 10 years: No  If all of the above answers are "NO", then may proceed with Cephalosporin use.        Immunization History  Administered Date(s) Administered  . Influenza,inj,Quad PF,6-35 Mos 04/26/2019    Outpatient Medications Prior to Visit  Medication Sig Dispense Refill  . ADVAIR DISKUS 250-50 MCG/DOSE AEPB Take 1 puff by mouth daily as needed (for shortness of breath).     Marland Kitchen albuterol (PROVENTIL HFA;VENTOLIN HFA)  108 (90 Base) MCG/ACT inhaler Inhale 1-2 puffs into the lungs every 6 (six) hours as needed for wheezing or shortness of breath.    Marland Kitchen. albuterol (PROVENTIL) (2.5 MG/3ML) 0.083% nebulizer solution Take 2.5 mg by nebulization every 6 (six) hours as needed for wheezing or shortness of breath.    . esomeprazole (NEXIUM) 40 MG capsule Take 40 mg by mouth at bedtime.    Marland Kitchen. HYDROcodone-acetaminophen (NORCO) 10-325 MG tablet Take 1-2 tablets by mouth every 4 (four) hours as needed. 100 tablet 0  . levothyroxine (SYNTHROID, LEVOTHROID) 25 MCG tablet Take 25 mcg by mouth at bedtime.    . rosuvastatin (CRESTOR) 40 MG tablet Take 40 mg by mouth at bedtime.    . sertraline (ZOLOFT) 100 MG tablet Take 100 mg by mouth at bedtime.    . docusate sodium (COLACE) 100 MG capsule Take 1 capsule (100 mg total) by mouth 2 (two) times daily. (Patient not taking: Reported on 12/14/2019) 10 capsule 0  . ferrous sulfate (FERROUSUL) 325 (65 FE) MG tablet Take 1 tablet (325 mg total) by mouth 3 (three) times daily with meals. (Patient not taking: Reported on 12/14/2019)    . formoterol (PERFOROMIST) 20 MCG/2ML nebulizer solution Take 20 mcg by nebulization daily as needed (for shortness of breath).    . methocarbamol (ROBAXIN) 500 MG tablet Take 1 tablet (500 mg total) by mouth every 6 (six) hours as needed for muscle spasms. (Patient not taking: Reported on 12/14/2019) 40 tablet 0  . polyethylene glycol (MIRALAX / GLYCOLAX) packet Take 17 g by mouth 2 (two) times daily. (Patient not taking: Reported on 12/14/2019) 14 each 0  . Tiotropium Bromide Monohydrate (SPIRIVA RESPIMAT) 2.5 MCG/ACT AERS Inhale 1 puff into  the lungs daily. (Patient not taking: Reported on 12/14/2019) 4 g 11  . varenicline (CHANTIX CONTINUING MONTH PAK) 1 MG tablet Take 1 tablet (1 mg total) by mouth 2 (two) times daily. (Patient not taking: Reported on 12/14/2019) 60 tablet 3  . varenicline (CHANTIX PAK) 0.5 MG X 11 & 1 MG X 42 tablet Take one 0.5 mg tablet by mouth once daily for 3 days, then increase to one 0.5 mg tablet twice daily for 4 days, then increase to one 1 mg tablet twice daily. (Patient not taking: Reported on 12/14/2019) 53 tablet 0   No facility-administered medications prior to visit.    ROS   Objective:   Vitals:   12/14/19 1357  BP: (!) 142/70  Pulse: 61  Temp: 98 F (36.7 C)  TempSrc: Oral  SpO2: 96%  Weight: 194 lb (88 kg)  Height: 5' 2.5" (1.588 m)   96% on   RA BMI Readings from Last 3 Encounters:  12/14/19 34.92 kg/m  09/29/19 36.03 kg/m  08/18/16 34.02 kg/m   Wt Readings from Last 3 Encounters:  12/14/19 194 lb (88 kg)  09/29/19 197 lb (89.4 kg)  08/18/16 189 lb (85.7 kg)    Physical Exam Vitals reviewed.  Constitutional:      General: She is not in acute distress.    Appearance: Normal appearance. She is not ill-appearing.  HENT:     Head: Normocephalic and atraumatic.  Eyes:     General: No scleral icterus. Cardiovascular:     Rate and Rhythm: Normal rate and regular rhythm.     Heart sounds: No murmur.  Pulmonary:     Comments: Breathing comfortably on RA, CTAB.  No witnessed coughing. Abdominal:     General: There is no distension.  Palpations: Abdomen is soft.     Tenderness: There is no abdominal tenderness.  Musculoskeletal:        General: No swelling or deformity.     Cervical back: Neck supple.  Lymphadenopathy:     Cervical: No cervical adenopathy.  Skin:    General: Skin is warm.     Findings: No rash.  Neurological:     General: No focal deficit present.     Mental Status: She is alert.     Coordination: Coordination normal.  Psychiatric:         Mood and Affect: Mood normal.        Behavior: Behavior normal.      CBC    Component Value Date/Time   WBC 11.2 (H) 08/19/2016 0437   RBC 4.11 08/19/2016 0437   HGB 12.9 08/19/2016 0437   HCT 37.2 08/19/2016 0437   PLT 196 08/19/2016 0437   MCV 90.5 08/19/2016 0437   MCH 31.4 08/19/2016 0437   MCHC 34.7 08/19/2016 0437   RDW 13.6 08/19/2016 0437   LYMPHSABS 2.5 03/26/2010 0525   MONOABS 0.8 03/26/2010 0525   EOSABS 0.2 03/26/2010 0525   BASOSABS 0.1 03/26/2010 0525    CHEMISTRY No results for input(s): NA, K, CL, CO2, GLUCOSE, BUN, CREATININE, CALCIUM, MG, PHOS in the last 168 hours. CrCl cannot be calculated (Patient's most recent lab result is older than the maximum 21 days allowed.).   Chest Imaging- films reviewed: CXR 2 view 08/08/2008- increased retrosternal airspace suggesting hyperinflation  CT chest 09/05/2019 performed at [facility] health-report only available.  No significant adenopathy.  5 mm subpleural medial LLL nodule new since May 2020.  Multiple other pulmonary nodules 3 mm or less, all unchanged.  Persistent but smaller bilateral adrenal nodules.   Pulmonary Functions Testing Results: PFT Results Latest Ref Rng & Units 12/14/2019  FVC-Pre L 2.58  FVC-Predicted Pre % 83  FVC-Post L 2.71  FVC-Predicted Post % 87  Pre FEV1/FVC % % 68  Post FEV1/FCV % % 72  FEV1-Pre L 1.77  FEV1-Predicted Pre % 73  FEV1-Post L 1.96  DLCO UNC% % 73  DLCO COR %Predicted % 71   2021- moderate obstruction with incomplete bronchodilator reversibility.  No restriction, hyperinflation, or air trapping.  Mild diffusion impairment.      Assessment & Plan:     ICD-10-CM   1. Chronic obstructive pulmonary disease, unspecified COPD type (HCC)  J44.9   2. Tobacco abuse  Z72.0   3. Lung nodule  R91.1      5 mm pulmonary nodule in a high risk patient given positive family history for lung cancer, ongoing tobacco abuse -74-month follow-up CT scan in August 2021. -Strongly  recommend full tobacco cessation.  COPD; previous PFTs unavailable -Okay to continue Advair twice daily per her preference.  Instructed to rinse mouth after every use. -Continue albuterol as needed -Strongly recommend tobacco cessation- we spent an extensive amount of time discussing strategies for quitting.  She can try to have assistance with nicotine replacement therapy 100 quit now.  Recommended cutting back by a few cigarettes every week or so after trying to decrease her total daily dose pf nicotine if she is unable to obtain NRT patches/ gum.  I also recommend trying to identify her triggers to identify new behaviors to replace smoking. -Up-to-date on seasonal flu vaccine.  Received Prevnar- 13 last visit, but no documented in the chart.  Needs Covid vaccine.  Ongoing tobacco abuse; previously intolerant to Chantix -Strongly  recommend cessation.  She seems motivated to do this. Recommend cutting back to titrate herself off cigarettes and working to create new habits to replace old habits with new habits.  RTC in 3 months.    Current Outpatient Medications:  .  ADVAIR DISKUS 250-50 MCG/DOSE AEPB, Take 1 puff by mouth daily as needed (for shortness of breath). , Disp: , Rfl:  .  albuterol (PROVENTIL HFA;VENTOLIN HFA) 108 (90 Base) MCG/ACT inhaler, Inhale 1-2 puffs into the lungs every 6 (six) hours as needed for wheezing or shortness of breath., Disp: , Rfl:  .  albuterol (PROVENTIL) (2.5 MG/3ML) 0.083% nebulizer solution, Take 2.5 mg by nebulization every 6 (six) hours as needed for wheezing or shortness of breath., Disp: , Rfl:  .  esomeprazole (NEXIUM) 40 MG capsule, Take 40 mg by mouth at bedtime., Disp: , Rfl:  .  HYDROcodone-acetaminophen (NORCO) 10-325 MG tablet, Take 1-2 tablets by mouth every 4 (four) hours as needed., Disp: 100 tablet, Rfl: 0 .  levothyroxine (SYNTHROID, LEVOTHROID) 25 MCG tablet, Take 25 mcg by mouth at bedtime., Disp: , Rfl:  .  rosuvastatin (CRESTOR) 40 MG  tablet, Take 40 mg by mouth at bedtime., Disp: , Rfl:  .  sertraline (ZOLOFT) 100 MG tablet, Take 100 mg by mouth at bedtime., Disp: , Rfl:  .  docusate sodium (COLACE) 100 MG capsule, Take 1 capsule (100 mg total) by mouth 2 (two) times daily. (Patient not taking: Reported on 12/14/2019), Disp: 10 capsule, Rfl: 0 .  ferrous sulfate (FERROUSUL) 325 (65 FE) MG tablet, Take 1 tablet (325 mg total) by mouth 3 (three) times daily with meals. (Patient not taking: Reported on 12/14/2019), Disp: , Rfl:  .  formoterol (PERFOROMIST) 20 MCG/2ML nebulizer solution, Take 20 mcg by nebulization daily as needed (for shortness of breath)., Disp: , Rfl:  .  methocarbamol (ROBAXIN) 500 MG tablet, Take 1 tablet (500 mg total) by mouth every 6 (six) hours as needed for muscle spasms. (Patient not taking: Reported on 12/14/2019), Disp: 40 tablet, Rfl: 0 .  polyethylene glycol (MIRALAX / GLYCOLAX) packet, Take 17 g by mouth 2 (two) times daily. (Patient not taking: Reported on 12/14/2019), Disp: 14 each, Rfl: 0 .  Tiotropium Bromide Monohydrate (SPIRIVA RESPIMAT) 2.5 MCG/ACT AERS, Inhale 1 puff into the lungs daily. (Patient not taking: Reported on 12/14/2019), Disp: 4 g, Rfl: 11 .  varenicline (CHANTIX CONTINUING MONTH PAK) 1 MG tablet, Take 1 tablet (1 mg total) by mouth 2 (two) times daily. (Patient not taking: Reported on 12/14/2019), Disp: 60 tablet, Rfl: 3 .  varenicline (CHANTIX PAK) 0.5 MG X 11 & 1 MG X 42 tablet, Take one 0.5 mg tablet by mouth once daily for 3 days, then increase to one 0.5 mg tablet twice daily for 4 days, then increase to one 1 mg tablet twice daily. (Patient not taking: Reported on 12/14/2019), Disp: 53 tablet, Rfl: 0    Steffanie Dunn, DO [facility] 12/14/2019 2:02 PM

## 2020-02-13 ENCOUNTER — Telehealth: Payer: Self-pay | Admitting: Critical Care Medicine

## 2020-02-13 NOTE — Telephone Encounter (Signed)
Working on this one.  

## 2020-02-14 NOTE — Telephone Encounter (Signed)
Pt scheduled at [facility] 8/5 @ 4:30, check in by 4.  Appt info given to pt.  Nothing further needed at this time per pt.

## 2020-02-16 ENCOUNTER — Encounter: Payer: Self-pay | Admitting: Critical Care Medicine

## 2020-02-22 NOTE — Progress Notes (Signed)
@Patient  ID: , female    DOB: [birth year], 64 y.o.   MRN: [id number]  Chief Complaint  Patient presents with  . Follow-up    Had CT-sob same, cough-brown    Referring provider: [id number], NP  HPI: 64 year old female, current smoker. PMH significant COPD, pulmonary nodules, tobacco abuse. Maintained on Advair, albuterol as needed.   Previous [facility] pulmonary encounters: OV 09/29/19: Dr. 10/01/19 Ms. Hoggard is a 64 year old woman with a history of tobacco abuse and COPD who presents for evaluation of pulmonary nodules.  She is accompanied by her Sister Maddie.  She recently had a follow-up chest CT that demonstrated a new 5 mm nodule.  She is very concerned given her family history of lung cancer in her mother.  She was diagnosed with COPD several years ago.  She uses Advair 2 50-50.  She has wheezing, shortness of breath, cough, and significant sputum production.  She cannot walk a full block outside, but pain in her legs limits her activity more than her shortness of breath.  She uses albuterol about once per week.  She reports that she had PFTs done last year [place].  She is an every day smoker, 1 pack/day.  She has smoked since the age of 33 (30 years), and smoked 2.5 packs/day at her heaviest.  She wants to quit, but has been unsuccessful using Chantix and nicotine replacement therapy in the past.  She wants to try Chantix again.  Nicotine replacement therapy gave her nausea.  She has had her flu shot and pneumococcal 23 vaccine.  12/14/19- Dr. 02/13/20 Ms. Coutts is a 64 y/o woman with a history of COPD who presents for follow up.  She is accompanied today by her sister.  She brought a disk from her May 2020 and Feb 2021 CT scans from [place]. She has no change in her symptoms since her last visit. She had no improvement in her symptoms when starting Spiriva so she switched back to Advair twice daily.  Her cough is at baseline, she uses her albuterol few days per week, but not daily.  She  continues to have a large volume of sputum.  She tried Chantix, but had frequent crying and felt depressed and subsequently stopped.  She continues to smoke 1 pack/day.  She is very nervous about her pulmonary nodules.   02/23/2020- Interim hx Patient presents today for 2 month follow-up COPD/pulmonary nodules. She has repeat CT chest on 02/16/20 at [facility] to follow-up on 92mm left lower lobe nodule. CT showed underlying emphysematous change. Previous 13mm nodules is no longer evident and likely represented atelectasis. Scattered 2-18mm nodules elsewhere are stable.   Her breathing is ok. She was previously using Advair off and on. She is consistently using it now d/t heat. She uses her rescue inhaler once a day. She has lost 4 lbs since last office visit. She was 236lb last year. Her appetite is poor. She no longer has hemoptysis. Discussed being apart of lung cancer screening program, patient has a difficult time getting rides to appointments but states that she will do what she needs to.    Chest Imaging- films reviewed: CXR 2 view 08/08/2008- increased retrosternal airspace suggesting hyperinflation  CT chest 09/05/2019 performed at [facility]-report only available.  No significant adenopathy.  5 mm subpleural medial LLL nodule new since May 2020.  Multiple other pulmonary nodules 3 mm or less, all unchanged.  Persistent but smaller bilateral adrenal nodules.  CT chest  02/16/20 performed [facility]- report showed several small pulmonary nodules upper lobes 2-293mm stable in size. Previous 5mm nodule LLL no longer evidence and likely represent atelectasis. No new pulmonary nodular opacities. Sub centimetere lymph nodules are stable. No adenopathy by size criteria. Aortic atherosclerosis with coronary artery calcification. Stable appearing benign adrenal adenomas bilaterally.   Allergies  Allergen Reactions  . Shellfish-Derived Products Swelling  . Codeine Nausea And Vomiting  .  Shellfish Allergy Swelling    Swelling of the tongue   . Sulfa Antibiotics Other (See Comments)    Pt can not remember - just knows she got very sick  . Penicillins Rash    Has patient had a PCN reaction causing immediate rash, facial/tongue/throat swelling, SOB or lightheadedness with hypotension: Unknown Has patient had a PCN reaction causing severe rash involving mucus membranes or skin necrosis: Unknown Has patient had a PCN reaction that required hospitalization: Unknown Has patient had a PCN reaction occurring within the last 10 years: No  If all of the above answers are "NO", then may proceed with Cephalosporin use.     Immunization History  Administered Date(s) Administered  . Influenza,inj,Quad PF,6-35 Mos 04/26/2019    Past Medical History:  Diagnosis Date  . Arthritis    DDD -back and srthritis right knee  . COPD (chronic obstructive pulmonary disease) (HCC)   . Depression   . Gas    lots of gas and bloating issues  . GERD (gastroesophageal reflux disease)   . H. pylori infection   . Hypertension    med was discontinued over a yr-"Blood pressure too low"  . Hypothyroidism   . PONV (postoperative nausea and vomiting)   . Sleep apnea    no cpap used    Tobacco History: Social History   Tobacco Use  Smoking Status Current Every Day Smoker  . Packs/day: 1.00  . Years: 52.00  . Pack years: 52.00  . Types: Cigarettes  Smokeless Tobacco Never Used  Tobacco Comment   20 cigarettes daily 12/14/19 [facility]    Ready to quit: Not Answered Counseling given: Not Answered Comment: 20 cigarettes daily 12/14/19 [facility]    Outpatient Medications Prior to Visit  Medication Sig Dispense Refill  . ADVAIR DISKUS 250-50 MCG/DOSE AEPB Take 1 puff by mouth daily as needed (for shortness of breath).     Marland Kitchen. albuterol (PROVENTIL HFA;VENTOLIN HFA) 108 (90 Base) MCG/ACT inhaler Inhale 1-2 puffs into the lungs every 6 (six) hours as needed for wheezing or shortness of breath.    .  esomeprazole (NEXIUM) 40 MG capsule Take 40 mg by mouth at bedtime.    Marland Kitchen. HYDROcodone-acetaminophen (NORCO) 10-325 MG tablet Take 1-2 tablets by mouth every 4 (four) hours as needed. 100 tablet 0  . levothyroxine (SYNTHROID, LEVOTHROID) 25 MCG tablet Take 25 mcg by mouth at bedtime.    . rosuvastatin (CRESTOR) 40 MG tablet Take 40 mg by mouth at bedtime.    . sertraline (ZOLOFT) 100 MG tablet Take 100 mg by mouth at bedtime.    Marland Kitchen. albuterol (PROVENTIL) (2.5 MG/3ML) 0.083% nebulizer solution Take 2.5 mg by nebulization every 6 (six) hours as needed for wheezing or shortness of breath. (Patient not taking: Reported on 02/23/2020)    . docusate sodium (COLACE) 100 MG capsule Take 1 capsule (100 mg total) by mouth 2 (two) times daily. (Patient not taking: Reported on 12/14/2019) 10 capsule 0  . ferrous sulfate (FERROUSUL) 325 (65 FE) MG tablet Take 1 tablet (325 mg total) by mouth  3 (three) times daily with meals. (Patient not taking: Reported on 12/14/2019)    . formoterol (PERFOROMIST) 20 MCG/2ML nebulizer solution Take 20 mcg by nebulization daily as needed (for shortness of breath). (Patient not taking: Reported on 02/23/2020)    . methocarbamol (ROBAXIN) 500 MG tablet Take 1 tablet (500 mg total) by mouth every 6 (six) hours as needed for muscle spasms. (Patient not taking: Reported on 12/14/2019) 40 tablet 0  . polyethylene glycol (MIRALAX / GLYCOLAX) packet Take 17 g by mouth 2 (two) times daily. (Patient not taking: Reported on 12/14/2019) 14 each 0  . Tiotropium Bromide Monohydrate (SPIRIVA RESPIMAT) 2.5 MCG/ACT AERS Inhale 1 puff into the lungs daily. (Patient not taking: Reported on 12/14/2019) 4 g 11  . varenicline (CHANTIX CONTINUING MONTH PAK) 1 MG tablet Take 1 tablet (1 mg total) by mouth 2 (two) times daily. (Patient not taking: Reported on 12/14/2019) 60 tablet 3  . varenicline (CHANTIX PAK) 0.5 MG X 11 & 1 MG X 42 tablet Take one 0.5 mg tablet by mouth once daily for 3 days, then increase to one 0.5 mg  tablet twice daily for 4 days, then increase to one 1 mg tablet twice daily. (Patient not taking: Reported on 12/14/2019) 53 tablet 0   No facility-administered medications prior to visit.   Review of Systems  Review of Systems  Constitutional: Positive for unexpected weight change. Negative for diaphoresis and fever.  Respiratory: Positive for cough. Negative for chest tightness, shortness of breath and wheezing.   Cardiovascular: Negative.    Physical Exam  BP 122/86 (BP Location: Left Arm, Cuff Size: Normal)   Pulse (!) 57   Temp (!) 97 F (36.1 C) (Oral)   Ht 5\' 2"  (1.575 m)   Wt 190 lb 6.4 oz (86.4 kg)   SpO2 97%   BMI 34.82 kg/m  Physical Exam Constitutional:      Appearance: Normal appearance.  HENT:     Head: Normocephalic and atraumatic.     Mouth/Throat:     Mouth: Mucous membranes are moist.     Pharynx: Oropharynx is clear. No oropharyngeal exudate or posterior oropharyngeal erythema.  Cardiovascular:     Rate and Rhythm: Normal rate and regular rhythm.  Pulmonary:     Effort: Pulmonary effort is normal.     Breath sounds: Normal breath sounds. No wheezing or rhonchi.  Musculoskeletal:        General: Normal range of motion.  Skin:    General: Skin is warm and dry.  Neurological:     General: No focal deficit present.     Mental Status: She is alert and oriented to person, place, and time. Mental status is at baseline.  Psychiatric:        Mood and Affect: Mood normal.        Behavior: Behavior normal.        Thought Content: Thought content normal.        Judgment: Judgment normal.      Lab Results:  CBC    Component Value Date/Time   WBC 11.2 (H) 08/19/2016 0437   RBC 4.11 08/19/2016 0437   HGB 12.9 08/19/2016 0437   HCT 37.2 08/19/2016 0437   PLT 196 08/19/2016 0437   MCV 90.5 08/19/2016 0437   MCH 31.4 08/19/2016 0437   MCHC 34.7 08/19/2016 0437   RDW 13.6 08/19/2016 0437   LYMPHSABS 2.5 03/26/2010 0525   MONOABS 0.8 03/26/2010 0525    EOSABS 0.2 03/26/2010 0525  BASOSABS 0.1 03/26/2010 0525    BMET    Component Value Date/Time   NA 136 08/19/2016 0437   K 4.8 08/19/2016 0437   CL 101 08/19/2016 0437   CO2 26 08/19/2016 0437   GLUCOSE 155 (H) 08/19/2016 0437   BUN 12 08/19/2016 0437   CREATININE 0.74 08/19/2016 0437   CALCIUM 8.7 (L) 08/19/2016 0437   GFRNONAA >60 08/19/2016 0437   GFRAA >60 08/19/2016 0437    BNP No results found for: BNP  ProBNP No results found for: PROBNP  Imaging: No results found.   Assessment & Plan:   Pulmonary nodules - [facility] on [facility]/16/20 showed underlying emphysematous change. Previous 75mm nodules is no longer evident and likely represented atelectasis. Sacttered 2-64mm nodules upper lobe are stable.  - She no longer has hemoptysis, she has lost 4 lbs in 2 months. Continue to monitor  - Will refer patient to lung cancer screening program (may need imaging near [place])  [place] - Discussed smoking cessation at length. Encourage patient taper amount she is smoking and pick target quit date. Recommend NRT such as nicotine gum or patches to help assist   COPD (chronic obstructive pulmonary disease) (HCC) - Stable, no recent exacerbations. Symptoms worse with heat/humidity - CT imaging 02/16/20 showed emphysema - Continue Advair 1 puff twice daily and prn albuterol hfa q 6 hours  - FU in 6 months    Glenford Bayley, NP 02/23/2020

## 2020-02-23 ENCOUNTER — Ambulatory Visit (INDEPENDENT_AMBULATORY_CARE_PROVIDER_SITE_OTHER): Payer: Medicare Other | Admitting: Primary Care

## 2020-02-23 ENCOUNTER — Encounter: Payer: Self-pay | Admitting: Primary Care

## 2020-02-23 ENCOUNTER — Other Ambulatory Visit: Payer: Self-pay

## 2020-02-23 DIAGNOSIS — R918 Other nonspecific abnormal finding of lung field: Secondary | ICD-10-CM | POA: Diagnosis not present

## 2020-02-23 DIAGNOSIS — F17200 Nicotine dependence, unspecified, uncomplicated: Secondary | ICD-10-CM

## 2020-02-23 DIAGNOSIS — J449 Chronic obstructive pulmonary disease, unspecified: Secondary | ICD-10-CM | POA: Diagnosis not present

## 2020-02-23 NOTE — Assessment & Plan Note (Addendum)
-   Stable, no recent exacerbations. Symptoms worse with heat/humidity - CT imaging 02/16/20 showed emphysema - Continue Advair 1 puff twice daily and prn albuterol hfa q 6 hours  - FU in 6 months

## 2020-02-23 NOTE — Patient Instructions (Addendum)
Nice seeing you today Ms Ethington  Results: - CT showed underlying emphysematous change.  - Previous 72mm nodules is no longer evident and likely represented atelectasis.  - Sacttered 2-26mm nodules upper lobe are stable.   Recommendations: - Continue Advair 1 puffs twice daily (rinse mouth after use) - Use albuterol 2 puffs every 4-6 hours as needed for breakthrough shortness of breath - Strongly encourage you to quit smoking- goal by next visit. Taper amount you arte smoking and pick target quit date  Referral: - Lung cancer screening program   Follow-up: - 6 months with Dr. Chestine Spore     Pulmonary Nodule A pulmonary nodule is tissue that has grown on your lung. A nodule may be cancer, but most nodules are not cancer. Follow these instructions at home:   Take over-the-counter and prescription medicines only as told by your doctor.  Do not use any products that have nicotine or tobacco, such as cigarettes and e-cigarettes. If you need help quitting, ask your doctor.  Keep all follow-up visits as told by your doctor. This is important. Contact a doctor if:  You have trouble breathing when doing activities.  You feel sick.  You feel more tired than normal.  You do not feel like eating.  You lose weight without trying.  You have chills.  You have night sweats. Get help right away if:  You cannot catch your breath.  You start making whistling sounds when breathing (wheezing).  You cannot stop coughing.  You cough up blood.  You get dizzy.  You feel like you are going to pass out (faint).  You have sudden chest pain.  You have a fever or symptoms for more than 2-3 days.  You have a fever and your symptoms suddenly get worse. Summary  A pulmonary nodule is tissue that has grown on your lung.  Most nodules are not cancer.  Your doctor will do tests to know what kind of nodule you have, and whether you need treatment for it. This information is not intended to  replace advice given to you by your health care provider. Make sure you discuss any questions you have with your health care provider. Document Revised: 07/24/2017 Document Reviewed: 07/29/2016 Elsevier Patient Education  2020 [employer].

## 2020-02-23 NOTE — Assessment & Plan Note (Signed)
-   Discussed smoking cessation at length. Encourage patient taper amount she is smoking and pick target quit date. Recommend NRT such as nicotine gum or patches to help assist

## 2020-02-23 NOTE — Assessment & Plan Note (Addendum)
-   Follow-up CT chest at [facility] on 02/16/20 showed underlying emphysematous change. Previous 63mm nodules is no longer evident and likely represented atelectasis. Sacttered 2-12mm nodules upper lobe are stable.  - She no longer has hemoptysis, she has lost 4 lbs in 2 months. Continue to monitor  - Will refer patient to lung cancer screening program (may need imaging near [place])

## 2020-02-23 NOTE — Addendum Note (Signed)
Addended by: Charlott Holler on: 02/23/2020 12:32 PM   Modules accepted: Orders

## 2020-02-27 ENCOUNTER — Encounter: Payer: Self-pay | Admitting: Critical Care Medicine

## 2020-02-27 NOTE — Progress Notes (Signed)
Follow-up CT scan performed at [facility] on 02/16/2020-report only available.  Centrilobular emphysema.  Stable 3 mm anterior RUL nodule, stable 2 mm anterior LUL nodule.  Stable 2 mm medial anterior LUL nodule.  Resolved nodule in superior RLL.  Adrenal adenomas bilaterally-left 1.5x1.3, right 1.3x1.2-stable.  No significant adenopathy.  Report to be scanned in the chart.  Steffanie Dunn, DO 02/27/20 7:46 PM [facility]

## 2020-03-02 ENCOUNTER — Other Ambulatory Visit (HOSPITAL_COMMUNITY): Payer: Medicare Other

## 2020-09-04 ENCOUNTER — Ambulatory Visit: Payer: Medicare Other | Admitting: Pulmonary Disease

## 2020-09-12 ENCOUNTER — Encounter: Payer: Self-pay | Admitting: Pulmonary Disease

## 2020-09-12 ENCOUNTER — Other Ambulatory Visit: Payer: Self-pay

## 2020-09-12 ENCOUNTER — Ambulatory Visit (INDEPENDENT_AMBULATORY_CARE_PROVIDER_SITE_OTHER): Payer: Medicare Other | Admitting: Pulmonary Disease

## 2020-09-12 VITALS — BP 124/82 | HR 78 | Temp 97.4°F | Ht 63.0 in | Wt 192.6 lb

## 2020-09-12 DIAGNOSIS — J449 Chronic obstructive pulmonary disease, unspecified: Secondary | ICD-10-CM

## 2020-09-12 DIAGNOSIS — Z87891 Personal history of nicotine dependence: Secondary | ICD-10-CM | POA: Diagnosis not present

## 2020-09-12 DIAGNOSIS — Z122 Encounter for screening for malignant neoplasm of respiratory organs: Secondary | ICD-10-CM | POA: Diagnosis not present

## 2020-09-12 DIAGNOSIS — R918 Other nonspecific abnormal finding of lung field: Secondary | ICD-10-CM | POA: Diagnosis not present

## 2020-09-12 NOTE — Patient Instructions (Signed)
Nice to meet you  Continue the Advair  We will plan to get a CT scan low-dose lung cancer screening in August 2022.  We will send the order to [facility] she can have it done there.  It will be helpful if you could bring the images with you at the follow-up visit.  We will try the plan the follow-up visit about a week after your CT scan.   Follow-up with Dr. Judeth Horn in 6 months, about 1 week after CT scan to be arranged at [facility] for lung cancer screening.

## 2020-09-12 NOTE — Progress Notes (Signed)
@Patient  ID: Jessica Lam, female    DOB: [birth year], 65 y.o.   MRN: [id number]  Chief Complaint  Patient presents with  . New Patient (Initial Visit)    Good and bad days    Referring provider: Dema Lam, Jessica F, NP  HPI: 65 year old female, current smoker. PMH significant COPD, pulmonary nodules, tobacco abuse. Maintained on Advair, albuterol as needed.   Previous [facility] pulmonary encounters: OV 09/29/19: Dr. Chestine Sporelark [name] is a 65 year old woman with a history of tobacco abuse and COPD who presents for evaluation of pulmonary nodules.  She is accompanied by her Sister Jessica Lam.  She recently had a follow-up chest CT that demonstrated a new 5 mm nodule.  She is very concerned given her family history of lung cancer in her mother.  She was diagnosed with COPD several years ago.  She uses Advair 2 50-50.  She has wheezing, shortness of breath, cough, and significant sputum production.  She cannot walk a full block outside, but pain in her legs limits her activity more than her shortness of breath.  She uses albuterol about once per week.  She reports that she had PFTs done last year [place].  She is an every day smoker, 1 pack/day.  She has smoked since the age of 89 (752 years), and smoked 2.5 packs/day at her heaviest.  She wants to quit, but has been unsuccessful using Chantix and nicotine replacement therapy in the past.  She wants to try Chantix again.  Nicotine replacement therapy gave her nausea.  She has had her flu shot and pneumococcal 23 vaccine.  12/14/19- Dr. Chestine Sporelark [name] is a 65 y/o woman with a history of COPD who presents for follow up.  She is accompanied today by her sister.  She brought a disk from her May 2020 and Feb 2021 CT scans from [place]. She has no change in her symptoms since her last visit. She had no improvement in her symptoms when starting Spiriva so she switched back to Advair twice daily.  Her cough is at baseline, she uses her albuterol few days per week, but not  daily.  She continues to have a large volume of sputum.  She tried Chantix, but had frequent crying and felt depressed and subsequently stopped.  She continues to smoke 1 pack/day.  She is very nervous about her pulmonary nodules.   09/12/2020 Jessica ManisBeth Walsh, NP Patient presents today for 2 month follow-up COPD/pulmonary nodules. She has repeat CT chest on [facility] to follow-up on 5mm left lower lobe nodule. CT showed underlying emphysematous change. Previous 5mm nodules is no longer evident and likely represented atelectasis. Scattered 2-583mm nodules elsewhere are stable.   Her breathing is ok. She was previously using Advair off and on. She is consistently using it now d/t heat. She uses her rescue inhaler once a day. She has lost 4 lbs since last office visit. She was 236lb last year. Her appetite is poor. She no longer has hemoptysis. Discussed being apart of lung cancer screening program, patient has a difficult time getting rides to appointments but states that she will do what she needs to.   09/12/2020- Interim history  Doing well. Breathing stable. No complaints. Had questions about results of CT scan 02/2020. She did visit with NP to review results at that time. Multiple nodules, smallest stable, largest resolved.   Prior pulmonary notes x 3 reviewed.  Chest Imaging- films reviewed: CXR 2 view 08/08/2008- increased retrosternal airspace suggesting  hyperinflation  CT chest 09/05/2019 performed at [facility] health-report only available.  No significant adenopathy.  5 mm subpleural medial LLL nodule new since May 2020.  Multiple other pulmonary nodules 3 mm or less, all unchanged.  Persistent but smaller bilateral adrenal nodules.  CT chest 02/16/20 performed at [facility]- report showed several small pulmonary nodules upper lobes 2-23mm stable in size. Previous 20mm nodule LLL no longer evidence and likely represent atelectasis. No new pulmonary nodular opacities. Sub centimetere  lymph nodules are stable. No adenopathy by size criteria. Aortic atherosclerosis with coronary artery calcification. Stable appearing benign adrenal adenomas bilaterally.   Allergies  Allergen Reactions  . Shellfish-Derived Products Swelling  . Codeine Nausea And Vomiting  . Shellfish Allergy Swelling    Swelling of the tongue   . Sulfa Antibiotics Other (See Comments)    Pt can not remember - just knows she got very sick  . Penicillins Rash    Has patient had a PCN reaction causing immediate rash, facial/tongue/throat swelling, SOB or lightheadedness with hypotension: Unknown Has patient had a PCN reaction causing severe rash involving mucus membranes or skin necrosis: Unknown Has patient had a PCN reaction that required hospitalization: Unknown Has patient had a PCN reaction occurring within the last 10 years: No  If all of the above answers are "NO", then may proceed with Cephalosporin use.     Immunization History  Administered Date(s) Administered  . Influenza,inj,Quad PF,6-35 Mos 04/26/2019    Past Medical History:  Diagnosis Date  . Arthritis    DDD -back and srthritis right knee  . COPD (chronic obstructive pulmonary disease) (HCC)   . Depression   . Gas    lots of gas and bloating issues  . GERD (gastroesophageal reflux disease)   . H. pylori infection   . Hypertension    med was discontinued over a yr-"Blood pressure too low"  . Hypothyroidism   . PONV (postoperative nausea and vomiting)   . Sleep apnea    no cpap used    Tobacco History: Social History   Tobacco Use  Smoking Status Current Every Day Smoker  . Packs/day: 1.00  . Years: 52.00  . Pack years: 52.00  . Types: Cigarettes  Smokeless Tobacco Never Used  Tobacco Comment   20 cigarettes daily 12/14/19 ARJ    Ready to quit: No Counseling given: Yes Comment: 20 cigarettes daily 12/14/19 ARJ    Outpatient Medications Prior to Visit  Medication Sig Dispense Refill  . ADVAIR DISKUS 250-50  MCG/DOSE AEPB Take 1 puff by mouth daily as needed (for shortness of breath).     Marland Kitchen albuterol (PROVENTIL HFA;VENTOLIN HFA) 108 (90 Base) MCG/ACT inhaler Inhale 1-2 puffs into the lungs every 6 (six) hours as needed for wheezing or shortness of breath.    Marland Kitchen albuterol (PROVENTIL) (2.5 MG/3ML) 0.083% nebulizer solution Take 2.5 mg by nebulization every 6 (six) hours as needed for wheezing or shortness of breath.    Marland Kitchen aspirin 325 MG tablet Take by mouth.    . busPIRone (BUSPAR) 5 MG tablet Take by mouth.    . docusate sodium (COLACE) 100 MG capsule Take 1 capsule (100 mg total) by mouth 2 (two) times daily. 10 capsule 0  . esomeprazole (NEXIUM) 40 MG capsule Take 40 mg by mouth at bedtime.    . ferrous sulfate (FERROUSUL) 325 (65 FE) MG tablet Take 1 tablet (325 mg total) by mouth 3 (three) times daily with meals.    . formoterol (PERFOROMIST) 20 MCG/2ML  nebulizer solution Take 20 mcg by nebulization daily as needed (for shortness of breath).    Marland Kitchen HYDROcodone-acetaminophen (NORCO) 10-325 MG tablet Take 1-2 tablets by mouth every 4 (four) hours as needed. 100 tablet 0  . levothyroxine (SYNTHROID, LEVOTHROID) 25 MCG tablet Take 25 mcg by mouth at bedtime.    . methocarbamol (ROBAXIN) 500 MG tablet Take 1 tablet (500 mg total) by mouth every 6 (six) hours as needed for muscle spasms. 40 tablet 0  . polyethylene glycol (MIRALAX / GLYCOLAX) packet Take 17 g by mouth 2 (two) times daily. 14 each 0  . rosuvastatin (CRESTOR) 40 MG tablet Take 40 mg by mouth at bedtime.    . sertraline (ZOLOFT) 100 MG tablet Take 100 mg by mouth at bedtime.    . varenicline (CHANTIX CONTINUING MONTH PAK) 1 MG tablet Take 1 tablet (1 mg total) by mouth 2 (two) times daily. 60 tablet 3  . varenicline (CHANTIX PAK) 0.5 MG X 11 & 1 MG X 42 tablet Take one 0.5 mg tablet by mouth once daily for 3 days, then increase to one 0.5 mg tablet twice daily for 4 days, then increase to one 1 mg tablet twice daily. 53 tablet 0  . Tiotropium  Bromide Monohydrate (SPIRIVA RESPIMAT) 2.5 MCG/ACT AERS Inhale 1 puff into the lungs daily. 4 g 11   No facility-administered medications prior to visit.   Review of Systems  n/a  Physical Exam  BP 124/82 (BP Location: Left Arm, Cuff Size: Normal)   Pulse 78   Temp (!) 97.4 F (36.3 C) (Oral)   Ht 5\' 3"  (1.6 m)   Wt 192 lb 9.6 oz (87.4 kg)   SpO2 97%   BMI 34.12 kg/m  Physical Exam Constitutional:      Appearance: Normal appearance.  HENT:     Head: Normocephalic and atraumatic.     Mouth/Throat:     Mouth: Mucous membranes are moist.     Pharynx: Oropharynx is clear. No oropharyngeal exudate or posterior oropharyngeal erythema.  Cardiovascular:     Rate and Rhythm: Normal rate and regular rhythm.  Pulmonary:     Effort: Pulmonary effort is normal.     Breath sounds: Normal breath sounds. No wheezing or rhonchi.  Musculoskeletal:        General: Normal range of motion.  Skin:    General: Skin is warm and dry.  Neurological:     General: No focal deficit present.     Mental Status: She is alert and oriented to person, place, and time. Mental status is at baseline.  Psychiatric:        Mood and Affect: Mood normal.        Behavior: Behavior normal.        Thought Content: Thought content normal.        Judgment: Judgment normal.      Lab Results: Personally reviewed  CBC    Component Value Date/Time   WBC 11.2 (H) 08/19/2016 0437   RBC 4.11 08/19/2016 0437   HGB 12.9 08/19/2016 0437   HCT 37.2 08/19/2016 0437   PLT 196 08/19/2016 0437   MCV 90.5 08/19/2016 0437   MCH 31.4 08/19/2016 0437   MCHC 34.7 08/19/2016 0437   RDW 13.6 08/19/2016 0437   LYMPHSABS 2.5 03/26/2010 0525   MONOABS 0.8 03/26/2010 0525   EOSABS 0.2 03/26/2010 0525   BASOSABS 0.1 03/26/2010 0525    BMET    Component Value Date/Time   NA 136 08/19/2016 0437  K 4.8 08/19/2016 0437   CL 101 08/19/2016 0437   CO2 26 08/19/2016 0437   GLUCOSE 155 (H) 08/19/2016 0437   BUN 12  08/19/2016 0437   CREATININE 0.74 08/19/2016 0437   CALCIUM 8.7 (L) 08/19/2016 0437   GFRNONAA >60 08/19/2016 0437   GFRAA >60 08/19/2016 0437    BNP No results found for: BNP  ProBNP No results found for: PROBNP   Assessment & Plan:   COPD: Stable. Continue Advair. Consider PFTs if breahting worsens.  Pulmonary nodules: multiple scattered < 6 mm 08/2019. Repeat CT 02/2020 with resolution of largest 5 mm nodule, stable smaller nodules. No further follow up.  Lung cancer screening: Using shared decision making and decision aid ([email or web address]) patient decided to pursue lung cancer screening. LDCT chest ordered 02/2021 at [facility], she is to bring images at next f/u appt to review.   I spent 50 minutes in patient care including face-to-face visit, review of prior records including images, labs, reports, notes, and coordination of care.  Karren Burly, MD 09/12/2020

## 2021-03-06 ENCOUNTER — Encounter: Payer: Self-pay | Admitting: Pulmonary Disease

## 2021-03-06 ENCOUNTER — Other Ambulatory Visit: Payer: Self-pay

## 2021-03-06 ENCOUNTER — Ambulatory Visit (INDEPENDENT_AMBULATORY_CARE_PROVIDER_SITE_OTHER): Payer: Medicare Other | Admitting: Pulmonary Disease

## 2021-03-06 VITALS — BP 130/82 | HR 60 | Temp 99.1°F | Ht 62.0 in | Wt 187.0 lb

## 2021-03-06 DIAGNOSIS — R918 Other nonspecific abnormal finding of lung field: Secondary | ICD-10-CM

## 2021-03-06 DIAGNOSIS — J449 Chronic obstructive pulmonary disease, unspecified: Secondary | ICD-10-CM | POA: Diagnosis not present

## 2021-03-06 DIAGNOSIS — Z122 Encounter for screening for malignant neoplasm of respiratory organs: Secondary | ICD-10-CM | POA: Diagnosis not present

## 2021-03-06 DIAGNOSIS — F17210 Nicotine dependence, cigarettes, uncomplicated: Secondary | ICD-10-CM

## 2021-03-06 DIAGNOSIS — Z87891 Personal history of nicotine dependence: Secondary | ICD-10-CM

## 2021-03-06 NOTE — Progress Notes (Signed)
@Patient  ID: , female    DOB: 10-22-1955, 65 y.o.   MRN: [id number]  Chief Complaint  Patient presents with   Follow-up    Patient is her for CT results. She brought the CD.     Referring provider: [id number], NP  HPI: 65 year old female, current smoker. PMH significant COPD, pulmonary nodules, tobacco abuse. Maintained on Advair, albuterol as needed.   Doing well. Breathing stable. No complaints. Denies significant DOE. Broke bone in foot few weeks ago. Recently out of boot. Had CT lung cancer screen 02/13/21 at [place]. Images reviewed that on my interpretation reveal significant emphysema with stable small nodules. Report is read as LUNGRADS 2.  Chest Imaging- films reviewed: CXR 2 view 08/08/2008- increased retrosternal airspace suggesting hyperinflation   CT chest 09/05/2019 performed at [facility] health-report only available.  No significant adenopathy.  5 mm subpleural medial LLL nodule new since May 2020.  Multiple other pulmonary nodules 3 mm or less, all unchanged.  Persistent but smaller bilateral adrenal nodules.  CT chest 02/16/20 performed at [facility]- report showed several small pulmonary nodules upper lobes 2-57mm stable in size. Previous 32mm nodule LLL no longer evidence and likely represent atelectasis. No new pulmonary nodular opacities. Sub centimetere lymph nodules are stable. No adenopathy by size criteria. Aortic atherosclerosis with coronary artery calcification. Stable appearing benign adrenal adenomas bilaterally.   Allergies  Allergen Reactions   Shellfish-Derived Products Swelling   Codeine Nausea And Vomiting   Shellfish Allergy Swelling    Swelling of the tongue    Sulfa Antibiotics Other (See Comments)    Pt can not remember - just knows she got very sick   Penicillins Rash    Has patient had a PCN reaction causing immediate rash, facial/tongue/throat swelling, SOB or lightheadedness with hypotension: Unknown Has patient had a PCN  reaction causing severe rash involving mucus membranes or skin necrosis: Unknown Has patient had a PCN reaction that required hospitalization: Unknown Has patient had a PCN reaction occurring within the last 10 years: No  If all of the above answers are "NO", then may proceed with Cephalosporin use.     Immunization History  Administered Date(s) Administered   Influenza,inj,Quad PF,6-35 Mos 04/26/2019    Past Medical History:  Diagnosis Date   Arthritis    DDD -back and srthritis right knee   COPD (chronic obstructive pulmonary disease) (HCC)    Depression    Gas    lots of gas and bloating issues   GERD (gastroesophageal reflux disease)    H. pylori infection    Hypertension    med was discontinued over a yr-"Blood pressure too low"   Hypothyroidism    PONV (postoperative nausea and vomiting)    Sleep apnea    no cpap used    Tobacco History: Social History   Tobacco Use  Smoking Status Every Day   Packs/day: 1.00   Years: 52.00   Pack years: 52.00   Types: Cigarettes  Smokeless Tobacco Never  Tobacco Comments   20 cigarettes daily 12/14/19 ARJ    Ready to quit: Not Answered Counseling given: Not Answered Tobacco comments: 20 cigarettes daily 12/14/19 ARJ    Outpatient Medications Prior to Visit  Medication Sig Dispense Refill   ADVAIR DISKUS 250-50 MCG/DOSE AEPB Take 1 puff by mouth daily as needed (for shortness of breath).      albuterol (PROVENTIL HFA;VENTOLIN HFA) 108 (90 Base) MCG/ACT inhaler Inhale 1-2 puffs into the lungs every 6 (  six) hours as needed for wheezing or shortness of breath.     albuterol (PROVENTIL) (2.5 MG/3ML) 0.083% nebulizer solution Take 2.5 mg by nebulization every 6 (six) hours as needed for wheezing or shortness of breath.     aspirin 325 MG tablet Take by mouth.     busPIRone (BUSPAR) 5 MG tablet Take by mouth.     docusate sodium (COLACE) 100 MG capsule Take 1 capsule (100 mg total) by mouth 2 (two) times daily. 10 capsule 0    esomeprazole (NEXIUM) 40 MG capsule Take 40 mg by mouth at bedtime.     ferrous sulfate (FERROUSUL) 325 (65 FE) MG tablet Take 1 tablet (325 mg total) by mouth 3 (three) times daily with meals.     formoterol (PERFOROMIST) 20 MCG/2ML nebulizer solution Take 20 mcg by nebulization daily as needed (for shortness of breath).     HYDROcodone-acetaminophen (NORCO) 10-325 MG tablet Take 1-2 tablets by mouth every 4 (four) hours as needed. 100 tablet 0   levothyroxine (SYNTHROID, LEVOTHROID) 25 MCG tablet Take 25 mcg by mouth at bedtime.     methocarbamol (ROBAXIN) 500 MG tablet Take 1 tablet (500 mg total) by mouth every 6 (six) hours as needed for muscle spasms. 40 tablet 0   polyethylene glycol (MIRALAX / GLYCOLAX) packet Take 17 g by mouth 2 (two) times daily. 14 each 0   rosuvastatin (CRESTOR) 40 MG tablet Take 40 mg by mouth at bedtime.     sertraline (ZOLOFT) 100 MG tablet Take 100 mg by mouth at bedtime.     varenicline (CHANTIX CONTINUING MONTH PAK) 1 MG tablet Take 1 tablet (1 mg total) by mouth 2 (two) times daily. 60 tablet 3   varenicline (CHANTIX PAK) 0.5 MG X 11 & 1 MG X 42 tablet Take one 0.5 mg tablet by mouth once daily for 3 days, then increase to one 0.5 mg tablet twice daily for 4 days, then increase to one 1 mg tablet twice daily. 53 tablet 0   No facility-administered medications prior to visit.   Review of Systems  n/a  Physical Exam  BP 130/82 (BP Location: Left Arm, Patient Position: Sitting, Cuff Size: Normal)   Pulse 60   Temp 99.1 F (37.3 C) (Oral) Comment: patient smoked prior to taking  Ht 5\' 2"  (1.575 m)   Wt 187 lb (84.8 kg)   SpO2 96%   BMI 34.20 kg/m  Physical Exam Constitutional:      Appearance: Normal appearance.  HENT:     Head: Normocephalic and atraumatic.     Mouth/Throat:     Mouth: Mucous membranes are moist.     Pharynx: Oropharynx is clear. No oropharyngeal exudate or posterior oropharyngeal erythema.  Cardiovascular:     Rate and Rhythm:  Normal rate and regular rhythm.  Pulmonary:     Effort: Pulmonary effort is normal.     Breath sounds: Normal breath sounds. No wheezing or rhonchi.  Musculoskeletal:        General: Normal range of motion.  Skin:    General: Skin is warm and dry.  Neurological:     General: No focal deficit present.     Mental Status: She is alert and oriented to person, place, and time. Mental status is at baseline.  Psychiatric:        Mood and Affect: Mood normal.        Behavior: Behavior normal.        Thought Content: Thought content normal.  Judgment: Judgment normal.     Lab Results: Personally reviewed  CBC    Component Value Date/Time   WBC 11.2 (H) 08/19/2016 0437   RBC 4.11 08/19/2016 0437   HGB 12.9 08/19/2016 0437   HCT 37.2 08/19/2016 0437   PLT 196 08/19/2016 0437   MCV 90.5 08/19/2016 0437   MCH 31.4 08/19/2016 0437   MCHC 34.7 08/19/2016 0437   RDW 13.6 08/19/2016 0437   LYMPHSABS 2.5 03/26/2010 0525   MONOABS 0.8 03/26/2010 0525   EOSABS 0.2 03/26/2010 0525   BASOSABS 0.1 03/26/2010 0525    BMET    Component Value Date/Time   NA 136 08/19/2016 0437   K 4.8 08/19/2016 0437   CL 101 08/19/2016 0437   CO2 26 08/19/2016 0437   GLUCOSE 155 (H) 08/19/2016 0437   BUN 12 08/19/2016 0437   CREATININE 0.74 08/19/2016 0437   CALCIUM 8.7 (L) 08/19/2016 0437   GFRNONAA >60 08/19/2016 0437   GFRAA >60 08/19/2016 0437    BNP No results found for: BNP  ProBNP No results found for: PROBNP   Assessment & Plan:   COPD: Stable. Continue Advair. Consider PFTs if breathing worsens.  Lung cancer screening: 8/202 scan reviewed and stable, LUNGRADS2. Using shared decision making and decision aid ([email or web address]) patient decided to pursue lung cancer screening. LDCT chest ordered 02/2022 at [facility].   Tobacco abuse:  Smoking assessment and cessation counseling Patient currently smoking. I have advised the patient to quit/stop smoking as soon as possible due to  high risk for multiple medical problems.  It will also be very difficult for [place] to manage patient's  respiratory symptoms and status if we continue to expose her lungs to a known irritant.    Patient is willing to quit smoking.  I have advised the patient that we can assist and have options of nicotine replacement therapy, provided smoking cessation education today, provided smoking cessation counseling, and provided cessation resources. She has failed Chantix as well as nicotine replacement.  She was successful in the past about 6 months with gradual reduction in cigarette smoking.  Counseled her to decrease number of cigarettes by 2 every 2 weeks in effort to minimize and ultimately quit smoking cigarettes.  She expressed understanding and agreement.  Karren Burly, MD 03/06/2021

## 2021-03-06 NOTE — Patient Instructions (Addendum)
Nice to see you again  The CT screening scan looks good, nothing suspicions. Repeat screening scan in August 2023.    No medication changes  Return to clinic to 6 months or sooner as needed with Dr. Judeth Horn

## 2022-02-20 ENCOUNTER — Encounter: Payer: Self-pay | Admitting: Pulmonary Disease

## 2022-02-20 ENCOUNTER — Ambulatory Visit (INDEPENDENT_AMBULATORY_CARE_PROVIDER_SITE_OTHER): Payer: Medicare HMO | Admitting: Pulmonary Disease

## 2022-02-20 VITALS — BP 124/62 | HR 55 | Ht 62.0 in | Wt 187.8 lb

## 2022-02-20 DIAGNOSIS — F17219 Nicotine dependence, cigarettes, with unspecified nicotine-induced disorders: Secondary | ICD-10-CM | POA: Diagnosis not present

## 2022-02-20 DIAGNOSIS — I2510 Atherosclerotic heart disease of native coronary artery without angina pectoris: Secondary | ICD-10-CM

## 2022-02-20 DIAGNOSIS — I2584 Coronary atherosclerosis due to calcified coronary lesion: Secondary | ICD-10-CM

## 2022-02-20 DIAGNOSIS — Z122 Encounter for screening for malignant neoplasm of respiratory organs: Secondary | ICD-10-CM

## 2022-02-20 NOTE — Progress Notes (Signed)
@Jessica Lam  ID: , female    DOB: 1955-10-16, 66 y.o.   MRN: [id number]  Chief Complaint  Jessica Lam presents with   Follow-up    Pt is her for follow up for COPD. Pt recently had CT scan done and brought disk in. Pt is on Advair daily and Ventolin as needed. Pt states the medication is working well for her COPD.     Referring provider: [id number], NP  HPI: 66 y.o. female, current smoker. PMH significant COPD, pulmonary nodules, tobacco abuse. Maintained on Advair, albuterol as needed.   Doing well. Breathing stable. No complaints. Denies significant DOE.  Really just issue with stairs.  Outdoors working in the garden.  Doing well.  Reviewed recent CT lung cancer screening [facility] 2023 Moderate interpretation reveals emphysema, stable nodules, about 4 mm, lung RADS 2 per report.  Coronary calcifications were noted as well.  Chest Imaging- films reviewed: CXR 2 view 08/08/2008- increased retrosternal airspace suggesting hyperinflation   CT chest 09/05/2019 performed at [facility]-report only available.  No significant adenopathy.  5 mm subpleural medial LLL nodule new since May 2020.  Multiple other pulmonary nodules 3 mm or less, all unchanged.  Persistent but smaller bilateral adrenal nodules.  CT chest 02/16/20 performed at [facility]- report showed several small pulmonary nodules upper lobes 2-81mm stable in size. Previous 90mm nodule LLL no longer evidence and likely represent atelectasis. No new pulmonary nodular opacities. Sub centimetere lymph nodules are stable. No adenopathy by size criteria. Aortic atherosclerosis with coronary artery calcification. Stable appearing benign adrenal adenomas bilaterally.   Allergies  Allergen Reactions   Shellfish-Derived Products Swelling   Codeine Nausea And Vomiting   Shellfish Allergy Swelling    Swelling of the tongue    Sulfa Antibiotics Other (See Comments)    Pt can not remember - just knows Jessica Lam got very sick   Penicillins  Rash    Has Jessica Lam had a PCN reaction causing immediate rash, facial/tongue/throat swelling, SOB or lightheadedness with hypotension: Unknown Has Jessica Lam had a PCN reaction causing severe rash involving mucus membranes or skin necrosis: Unknown Has Jessica Lam had a PCN reaction that required hospitalization: Unknown Has Jessica Lam had a PCN reaction occurring within the last 10 years: No  If all of the above answers are "NO", then may proceed with Cephalosporin use.     Immunization History  Administered Date(s) Administered   Influenza,inj,Quad PF,6-35 Mos 04/26/2019    Past Medical History:  Diagnosis Date   Arthritis    DDD -back and srthritis right knee   COPD (chronic obstructive pulmonary disease) (HCC)    Depression    Gas    lots of gas and bloating issues   GERD (gastroesophageal reflux disease)    H. pylori infection    Hypertension    med was discontinued over a yr-"Blood pressure too low"   Hypothyroidism    PONV (postoperative nausea and vomiting)    Sleep apnea    no cpap used    Tobacco History: Social History   Tobacco Use  Smoking Status Every Day   Packs/day: 1.00   Years: 52.00   Total pack years: 52.00   Types: Cigarettes  Smokeless Tobacco Never  Tobacco Comments   Pt states Jessica Lam smokes 1 ppd. ALS 02/20/22   Ready to quit: Not Answered Counseling given: Not Answered Tobacco comments: Pt states Jessica Lam smokes 1 ppd. ALS 02/20/22   Outpatient Medications Prior to Visit  Medication Sig Dispense Refill   ADVAIR  DISKUS 250-50 MCG/DOSE AEPB Take 1 puff by mouth daily as needed (for shortness of breath).      albuterol (PROVENTIL HFA;VENTOLIN HFA) 108 (90 Base) MCG/ACT inhaler Inhale 1-2 puffs into the lungs every 6 (six) hours as needed for wheezing or shortness of breath.     albuterol (PROVENTIL) (2.5 MG/3ML) 0.083% nebulizer solution Take 2.5 mg by nebulization every 6 (six) hours as needed for wheezing or shortness of breath.     aspirin 325 MG tablet  Take by mouth.     busPIRone (BUSPAR) 5 MG tablet Take by mouth.     docusate sodium (COLACE) 100 MG capsule Take 1 capsule (100 mg total) by mouth 2 (two) times daily. 10 capsule 0   esomeprazole (NEXIUM) 40 MG capsule Take 40 mg by mouth at bedtime.     ferrous sulfate (FERROUSUL) 325 (65 FE) MG tablet Take 1 tablet (325 mg total) by mouth 3 (three) times daily with meals.     formoterol (PERFOROMIST) 20 MCG/2ML nebulizer solution Take 20 mcg by nebulization daily as needed (for shortness of breath).     HYDROcodone-acetaminophen (NORCO) 10-325 MG tablet Take 1-2 tablets by mouth every 4 (four) hours as needed. 100 tablet 0   levothyroxine (SYNTHROID, LEVOTHROID) 25 MCG tablet Take 25 mcg by mouth at bedtime.     methocarbamol (ROBAXIN) 500 MG tablet Take 1 tablet (500 mg total) by mouth every 6 (six) hours as needed for muscle spasms. 40 tablet 0   polyethylene glycol (MIRALAX / GLYCOLAX) packet Take 17 g by mouth 2 (two) times daily. 14 each 0   rosuvastatin (CRESTOR) 40 MG tablet Take 40 mg by mouth at bedtime.     sertraline (ZOLOFT) 100 MG tablet Take 100 mg by mouth at bedtime.     varenicline (CHANTIX CONTINUING MONTH PAK) 1 MG tablet Take 1 tablet (1 mg total) by mouth 2 (two) times daily. 60 tablet 3   varenicline (CHANTIX PAK) 0.5 MG X 11 & 1 MG X 42 tablet Take one 0.5 mg tablet by mouth once daily for 3 days, then increase to one 0.5 mg tablet twice daily for 4 days, then increase to one 1 mg tablet twice daily. 53 tablet 0   No facility-administered medications prior to visit.   Review of Systems  n/a  Physical Exam  BP 124/62 (BP Location: Left Arm, Cuff Size: Normal)   Pulse (!) 55   Ht 5\' 2"  (1.575 m)   Wt 187 lb 12.8 oz (85.2 kg)   SpO2 94%   BMI 34.35 kg/m  Physical Exam Constitutional:      Appearance: Normal appearance.  HENT:     Head: Normocephalic and atraumatic.     Mouth/Throat:     Mouth: Mucous membranes are moist.     Pharynx: Oropharynx is clear. No  oropharyngeal exudate or posterior oropharyngeal erythema.  Cardiovascular:     Rate and Rhythm: Normal rate and regular rhythm.  Pulmonary:     Effort: Pulmonary effort is normal.     Breath sounds: Normal breath sounds. No wheezing or rhonchi.  Musculoskeletal:        General: Normal range of motion.  Skin:    General: Skin is warm and dry.  Neurological:     General: No focal deficit present.     Mental Status: Jessica Lam is alert and oriented to person, place, and time. Mental status is at baseline.  Psychiatric:        Mood and Affect: Mood  normal.        Behavior: Behavior normal.        Thought Content: Thought content normal.        Judgment: Judgment normal.      Lab Results: Personally reviewed  CBC    Component Value Date/Time   WBC 11.2 (H) 08/19/2016 0437   RBC 4.11 08/19/2016 0437   HGB 12.9 08/19/2016 0437   HCT 37.2 08/19/2016 0437   PLT 196 08/19/2016 0437   MCV 90.5 08/19/2016 0437   MCH 31.4 08/19/2016 0437   MCHC 34.7 08/19/2016 0437   RDW 13.6 08/19/2016 0437   LYMPHSABS 2.5 03/26/2010 0525   MONOABS 0.8 03/26/2010 0525   EOSABS 0.2 03/26/2010 0525   BASOSABS 0.1 03/26/2010 0525    BMET    Component Value Date/Time   NA 136 08/19/2016 0437   K 4.8 08/19/2016 0437   CL 101 08/19/2016 0437   CO2 26 08/19/2016 0437   GLUCOSE 155 (H) 08/19/2016 0437   BUN 12 08/19/2016 0437   CREATININE 0.74 08/19/2016 0437   CALCIUM 8.7 (L) 08/19/2016 0437   GFRNONAA >60 08/19/2016 0437   GFRAA >60 08/19/2016 0437    BNP No results found for: "BNP"  ProBNP No results found for: "PROBNP"   Assessment & Plan:   COPD: Stable. Continue Advair. Consider PFTs if breathing worsens.  Lung cancer screening: 02/2022 scan reviewed and stable, LUNGRADS2. Using shared decision making and decision aid ([email or web address]) Jessica Lam decided to pursue lung cancer screening. LDCT chest [facility] 02/2023 at [facility].   Coronary artery calcifications: Referral to cardiology  today.  Jessica Lam reports left heart cath nonobstructive at least 10 years ago.  Karren Burly, MD 02/20/2022

## 2022-02-20 NOTE — Patient Instructions (Signed)
To see you again  Your recent CT scan looks good, we will just plan to repeat one 1 year to continue with lung cancer screening  I will send a referral to a cardiologist in [place] to look into the calcifications in the coronary arteries.  I think it is worth hearing what they have to say and get their advice.  Return to clinic in 1 year after repeat CT scan, or sooner as needed

## 2022-03-20 ENCOUNTER — Ambulatory Visit: Payer: Medicare HMO | Admitting: Cardiology

## 2023-03-03 ENCOUNTER — Other Ambulatory Visit: Payer: Self-pay | Admitting: Family

## 2023-03-03 DIAGNOSIS — Z1231 Encounter for screening mammogram for malignant neoplasm of breast: Secondary | ICD-10-CM

## 2023-03-06 ENCOUNTER — Ambulatory Visit: Admission: RE | Admit: 2023-03-06 | Payer: Medicare HMO | Source: Ambulatory Visit

## 2023-03-06 DIAGNOSIS — Z1231 Encounter for screening mammogram for malignant neoplasm of breast: Secondary | ICD-10-CM

## 2023-03-09 ENCOUNTER — Encounter: Payer: Self-pay | Admitting: Pulmonary Disease

## 2023-03-09 ENCOUNTER — Ambulatory Visit: Payer: Medicare HMO | Admitting: Pulmonary Disease

## 2023-03-09 VITALS — BP 138/76 | HR 61 | Ht 62.5 in | Wt 174.2 lb

## 2023-03-09 DIAGNOSIS — R918 Other nonspecific abnormal finding of lung field: Secondary | ICD-10-CM

## 2023-03-09 DIAGNOSIS — J449 Chronic obstructive pulmonary disease, unspecified: Secondary | ICD-10-CM | POA: Diagnosis not present

## 2023-03-09 DIAGNOSIS — F17210 Nicotine dependence, cigarettes, uncomplicated: Secondary | ICD-10-CM

## 2023-03-09 NOTE — Progress Notes (Addendum)
@Patient  ID: Jessica Lam, female    DOB: May 31, 1956, 67 y.o.   MRN: [id number]  Chief Complaint  Patient presents with   Follow-up    Pt here for 1 yr F/U after CT. Pt has CT 02-20-2023. Pt has no problems today. Want s to discuss CT.    Referring provider: Erskine Emery, NP  HPI: 67 y.o. female, current smoker. PMH significant COPD, pulmonary nodules, tobacco abuse. Maintained on Breo, albuterol as needed.   Doing well. Breathing stable. No complaints. Denies significant DOE.  Really just issue with stairs.  Outdoors working in the garden.  Doing well.  Reviewed recent CT lung cancer screening 02/2023.  On my review and interpretation images reveal emphysema, stable nodules, largest about 4 mm, lung RADS 2 per report.  Coronary calcifications were noted as well, once again.  Chest Imaging- films reviewed: CXR 2 view 08/08/2008- increased retrosternal airspace suggesting hyperinflation   CT chest 09/05/2019 performed at [facility]-report only available.  No significant adenopathy.  5 mm subpleural medial LLL nodule new since May 2020.  Multiple other pulmonary nodules 3 mm or less, all unchanged.  Persistent but smaller bilateral adrenal nodules.  CT chest 02/16/20 performed at [facility]- report showed several small pulmonary nodules upper lobes 2-70mm stable in size. Previous 5mm nodule LLL no longer evidence and likely represent atelectasis. No new pulmonary nodular opacities. Sub centimetere lymph nodules are stable. No adenopathy by size criteria. Aortic atherosclerosis with coronary artery calcification. Stable appearing benign adrenal adenomas bilaterally.   Allergies  Allergen Reactions   Shellfish-Derived Products Swelling   Codeine Nausea And Vomiting   Shellfish Allergy Swelling    Swelling of the tongue    Sulfa Antibiotics Other (See Comments)    Pt can not remember - just knows she got very sick   Penicillins Rash    Has patient had a PCN reaction causing  immediate rash, facial/tongue/throat swelling, SOB or lightheadedness with hypotension: Unknown Has patient had a PCN reaction causing severe rash involving mucus membranes or skin necrosis: Unknown Has patient had a PCN reaction that required hospitalization: Unknown Has patient had a PCN reaction occurring within the last 10 years: No  If all of the above answers are "NO", then may proceed with Cephalosporin use.     Immunization History  Administered Date(s) Administered   Influenza,inj,Quad PF,6-35 Mos 04/26/2019    Past Medical History:  Diagnosis Date   Arthritis    DDD -back and srthritis right knee   COPD (chronic obstructive pulmonary disease) (HCC)    Depression    Gas    lots of gas and bloating issues   GERD (gastroesophageal reflux disease)    H. pylori infection    Hypertension    med was discontinued over a yr-"Blood pressure too low"   Hypothyroidism    PONV (postoperative nausea and vomiting)    Sleep apnea    no cpap used    Tobacco History: Social History   Tobacco Use  Smoking Status Every Day   Current packs/day: 1.00   Average packs/day: 1 pack/day for 52.0 years (52.0 ttl pk-yrs)   Types: Cigarettes  Smokeless Tobacco Never  Tobacco Comments   Pt states she smokes 1 ppd. ALS 02/20/22   Ready to quit: Not Answered Counseling given: Not Answered Tobacco comments: Pt states she smokes 1 ppd. ALS 02/20/22   Outpatient Medications Prior to Visit  Medication Sig Dispense Refill   ADVAIR DISKUS 250-50 MCG/DOSE AEPB Take 1  puff by mouth daily as needed (for shortness of breath).      albuterol (PROVENTIL) (2.5 MG/3ML) 0.083% nebulizer solution Take 2.5 mg by nebulization every 6 (six) hours as needed for wheezing or shortness of breath.     aspirin 325 MG tablet Take by mouth.     BREO ELLIPTA 200-25 MCG/ACT AEPB Inhale 1 puff into the lungs daily.     busPIRone (BUSPAR) 5 MG tablet Take by mouth.     esomeprazole (NEXIUM) 40 MG capsule Take 40 mg  by mouth at bedtime.     ferrous sulfate (FERROUSUL) 325 (65 FE) MG tablet Take 1 tablet (325 mg total) by mouth 3 (three) times daily with meals.     levothyroxine (SYNTHROID, LEVOTHROID) 25 MCG tablet Take 25 mcg by mouth at bedtime.     rosuvastatin (CRESTOR) 40 MG tablet Take 40 mg by mouth at bedtime.     sertraline (ZOLOFT) 100 MG tablet Take 100 mg by mouth at bedtime.     albuterol (PROVENTIL HFA;VENTOLIN HFA) 108 (90 Base) MCG/ACT inhaler Inhale 1-2 puffs into the lungs every 6 (six) hours as needed for wheezing or shortness of breath.     docusate sodium (COLACE) 100 MG capsule Take 1 capsule (100 mg total) by mouth 2 (two) times daily. 10 capsule 0   formoterol (PERFOROMIST) 20 MCG/2ML nebulizer solution Take 20 mcg by nebulization daily as needed (for shortness of breath).     HYDROcodone-acetaminophen (NORCO) 10-325 MG tablet Take 1-2 tablets by mouth every 4 (four) hours as needed. 100 tablet 0   methocarbamol (ROBAXIN) 500 MG tablet Take 1 tablet (500 mg total) by mouth every 6 (six) hours as needed for muscle spasms. 40 tablet 0   polyethylene glycol (MIRALAX / GLYCOLAX) packet Take 17 g by mouth 2 (two) times daily. 14 each 0   varenicline (CHANTIX CONTINUING MONTH PAK) 1 MG tablet Take 1 tablet (1 mg total) by mouth 2 (two) times daily. 60 tablet 3   varenicline (CHANTIX PAK) 0.5 MG X 11 & 1 MG X 42 tablet Take one 0.5 mg tablet by mouth once daily for 3 days, then increase to one 0.5 mg tablet twice daily for 4 days, then increase to one 1 mg tablet twice daily. 53 tablet 0   No facility-administered medications prior to visit.   Review of Systems  n/a  Physical Exam  BP 138/76 (BP Location: Left Arm, Cuff Size: Normal)   Pulse 61   Ht 5' 2.5" (1.588 m)   Wt 174 lb 3.2 oz (79 kg)   SpO2 98%   BMI 31.35 kg/m  Physical Exam Constitutional:      Appearance: Normal appearance.  HENT:     Head: Normocephalic and atraumatic.     Mouth/Throat:     Mouth: Mucous  membranes are moist.     Pharynx: Oropharynx is clear. No oropharyngeal exudate or posterior oropharyngeal erythema.  Cardiovascular:     Rate and Rhythm: Normal rate and regular rhythm.  Pulmonary:     Effort: Pulmonary effort is normal.     Breath sounds: Normal breath sounds. No wheezing or rhonchi.  Musculoskeletal:        General: Normal range of motion.  Skin:    General: Skin is warm and dry.  Neurological:     General: No focal deficit present.     Mental Status: She is alert and oriented to person, place, and time. Mental status is at baseline.  Psychiatric:  Mood and Affect: Mood normal.        Behavior: Behavior normal.        Thought Content: Thought content normal.        Judgment: Judgment normal.      Lab Results: Personally reviewed  CBC    Component Value Date/Time   WBC 11.2 (H) 08/19/2016 0437   RBC 4.11 08/19/2016 0437   HGB 12.9 08/19/2016 0437   HCT 37.2 08/19/2016 0437   PLT 196 08/19/2016 0437   MCV 90.5 08/19/2016 0437   MCH 31.4 08/19/2016 0437   MCHC 34.7 08/19/2016 0437   RDW 13.6 08/19/2016 0437   LYMPHSABS 2.5 03/26/2010 0525   MONOABS 0.8 03/26/2010 0525   EOSABS 0.2 03/26/2010 0525   BASOSABS 0.1 03/26/2010 0525    BMET    Component Value Date/Time   NA 136 08/19/2016 0437   K 4.8 08/19/2016 0437   CL 101 08/19/2016 0437   CO2 26 08/19/2016 0437   GLUCOSE 155 (H) 08/19/2016 0437   BUN 12 08/19/2016 0437   CREATININE 0.74 08/19/2016 0437   CALCIUM 8.7 (L) 08/19/2016 0437   GFRNONAA >60 08/19/2016 0437   GFRAA >60 08/19/2016 0437    BNP No results found for: "BNP"  ProBNP No results found for: "PROBNP"   Assessment & Plan:   COPD: Stable. Continue Breo. Consider PFTs if breathing worsens.  Lung cancer screening: 02/2023 scan reviewed and stable, LUNGRADS2. Using shared decision making and decision aid ([email or web address]) patient decided to continue  lung cancer screening. LDCT chest ordered 02/2024 at [facility].    Coronary artery calcifications: Referral to cardiology last year - never made appointment.  She declines referral today.  She reports left heart cath nonobstructive at least 10 years ago.  Smoking assessment and cessation counseling  I have advised the patient to quit/stop smoking as soon as possible due to high risk for multiple medical problems.  It will also be very difficult for [place] to manage patient's  respiratory symptoms and status if we continue to expose her lungs to a known irritant.  We do not advise e-cigarettes as a form of stopping smoking. Patient is not willing to quit smoking. I have advised the patient that we can assist and have options of nicotine replacement therapy, provided smoking cessation education today, provided smoking cessation counseling, and provided cessation resources. Follow-up next office visit office visit for assessment of smoking cessation.  I spent 4 minutes in tobacco/smoking cessation counseling.   Karren Burly, MD 03/09/2023

## 2023-03-09 NOTE — Patient Instructions (Signed)
Nice to see you  CT was stable - everything is ok  We will repeat a CT next year and follow up in clinic to discuss results.  Follow up in 1 year or sooner as needed

## 2023-03-11 ENCOUNTER — Other Ambulatory Visit: Payer: Self-pay | Admitting: Family

## 2023-03-11 DIAGNOSIS — R928 Other abnormal and inconclusive findings on diagnostic imaging of breast: Secondary | ICD-10-CM

## 2023-03-20 ENCOUNTER — Ambulatory Visit
Admission: RE | Admit: 2023-03-20 | Discharge: 2023-03-20 | Disposition: A | Discharge status: Home / Self Care | Payer: Medicare HMO | Source: Ambulatory Visit | Attending: Family | Admitting: Family

## 2023-03-20 ENCOUNTER — Other Ambulatory Visit: Payer: Self-pay | Admitting: Family

## 2023-03-20 DIAGNOSIS — R928 Other abnormal and inconclusive findings on diagnostic imaging of breast: Secondary | ICD-10-CM

## 2023-03-20 DIAGNOSIS — R921 Mammographic calcification found on diagnostic imaging of breast: Secondary | ICD-10-CM

## 2023-03-25 ENCOUNTER — Ambulatory Visit
Admission: RE | Admit: 2023-03-25 | Discharge: 2023-03-25 | Disposition: A | Discharge status: Home / Self Care | Payer: Medicare HMO | Source: Ambulatory Visit | Attending: Family | Admitting: Family

## 2023-03-25 DIAGNOSIS — R921 Mammographic calcification found on diagnostic imaging of breast: Secondary | ICD-10-CM

## 2023-03-25 DIAGNOSIS — R928 Other abnormal and inconclusive findings on diagnostic imaging of breast: Secondary | ICD-10-CM

## 2023-03-25 HISTORY — PX: BREAST BIOPSY: SHX20

## 2023-03-26 LAB — SURGICAL PATHOLOGY

## 2023-05-18 ENCOUNTER — Other Ambulatory Visit: Payer: Self-pay | Admitting: Emergency Medicine

## 2023-05-18 ENCOUNTER — Telehealth: Payer: Self-pay | Admitting: Acute Care

## 2023-05-18 DIAGNOSIS — Z87891 Personal history of nicotine dependence: Secondary | ICD-10-CM

## 2023-05-18 DIAGNOSIS — F17210 Nicotine dependence, cigarettes, uncomplicated: Secondary | ICD-10-CM

## 2023-05-18 DIAGNOSIS — Z122 Encounter for screening for malignant neoplasm of respiratory organs: Secondary | ICD-10-CM

## 2023-05-18 NOTE — Telephone Encounter (Signed)
I have printed it and will get the auth then schedule it

## 2023-05-18 NOTE — Telephone Encounter (Signed)
Patient has been screened and scheduled for her SDMV - 05/21/2023 with Orpha Bur. Patient wants her LDCT in [place]. Order placed. Patient aware she will be contacted to get this scheduled.

## 2023-05-21 ENCOUNTER — Ambulatory Visit: Payer: Medicare HMO | Admitting: Acute Care

## 2023-05-21 ENCOUNTER — Other Ambulatory Visit: Payer: Self-pay

## 2023-05-21 DIAGNOSIS — Z87891 Personal history of nicotine dependence: Secondary | ICD-10-CM

## 2023-05-21 DIAGNOSIS — F17210 Nicotine dependence, cigarettes, uncomplicated: Secondary | ICD-10-CM

## 2023-05-21 DIAGNOSIS — Z122 Encounter for screening for malignant neoplasm of respiratory organs: Secondary | ICD-10-CM

## 2023-05-21 NOTE — Patient Instructions (Signed)

## 2023-05-21 NOTE — Progress Notes (Signed)
 Virtual Visit via Video Note  I connected with Jessica Lam on 05/21/23 at 12:30 PM EST by a video enabled telemedicine application and verified that I am speaking with the correct person using two identifiers.  Location: Patient: in home Provider: [address], [place], [place], [address]    Shared Decision Making Visit Lung Cancer Screening Program [phone])   Eligibility: Age 67 y.o. Pack Years Smoking History Calculation 56 (# packs/per year x # years smoked) Recent History of coughing up blood  no Unexplained weight loss? no ( >Than 15 pounds within the last 6 months ) Prior History Lung / other cancer no (Diagnosis within the last 5 years already requiring surveillance chest CT Scans). Smoking Status Current Smoker Former Smokers: Years since quit: NA  Quit Date: NA  Visit Components: Discussion included one or more decision making aids. yes Discussion included risk/benefits of screening. yes Discussion included potential follow up diagnostic testing for abnormal scans. yes Discussion included meaning and risk of over diagnosis. yes Discussion included meaning and risk of False Positives. yes Discussion included meaning of total radiation exposure. yes  Counseling Included: Importance of adherence to annual lung cancer LDCT screening. yes Impact of comorbidities on ability to participate in the program. yes Ability and willingness to under diagnostic treatment. yes  Smoking Cessation Counseling: Current Smokers:  Discussed importance of smoking cessation. yes Information about tobacco cessation classes and interventions provided to patient. yes Patient provided with "ticket" for LDCT Scan. yes Symptomatic Patient. no  Counseling NA Diagnosis Code: Tobacco Use Z72.0 Asymptomatic Patient yes  Counseling (Intermediate counseling: > three minutes counseling) U0454 Former Smokers:  Discussed the importance of maintaining cigarette abstinence. yes Diagnosis  Code: Personal History of Nicotine Dependence. U98.119 Information about tobacco cessation classes and interventions provided to patient. Yes Patient provided with "ticket" for LDCT Scan. yes Written Order for Lung Cancer Screening with LDCT placed in Epic. Yes (CT Chest Lung Cancer Screening Low Dose W/O CM) [id number] Z12.2-Screening of respiratory organs Z87.891-Personal history of nicotine dependence   Karl Bales, RN 05/21/23

## 2023-12-09 ENCOUNTER — Other Ambulatory Visit: Payer: Self-pay | Admitting: Family

## 2023-12-09 DIAGNOSIS — Z1231 Encounter for screening mammogram for malignant neoplasm of breast: Secondary | ICD-10-CM

## 2024-02-04 ENCOUNTER — Encounter: Payer: Self-pay | Admitting: Acute Care

## 2024-02-29 ENCOUNTER — Ambulatory Visit (INDEPENDENT_AMBULATORY_CARE_PROVIDER_SITE_OTHER)
Admission: RE | Admit: 2024-02-29 | Discharge: 2024-02-29 | Disposition: A | Discharge status: Home / Self Care | Source: Ambulatory Visit | Attending: Family Medicine | Admitting: Family Medicine

## 2024-02-29 DIAGNOSIS — Z122 Encounter for screening for malignant neoplasm of respiratory organs: Secondary | ICD-10-CM | POA: Diagnosis not present

## 2024-02-29 DIAGNOSIS — F17210 Nicotine dependence, cigarettes, uncomplicated: Secondary | ICD-10-CM

## 2024-02-29 DIAGNOSIS — Z87891 Personal history of nicotine dependence: Secondary | ICD-10-CM

## 2024-03-15 ENCOUNTER — Other Ambulatory Visit: Payer: Self-pay | Admitting: Acute Care

## 2024-03-15 DIAGNOSIS — F17210 Nicotine dependence, cigarettes, uncomplicated: Secondary | ICD-10-CM

## 2024-03-15 DIAGNOSIS — Z122 Encounter for screening for malignant neoplasm of respiratory organs: Secondary | ICD-10-CM

## 2024-03-15 DIAGNOSIS — Z87891 Personal history of nicotine dependence: Secondary | ICD-10-CM

## 2024-03-17 ENCOUNTER — Telehealth (HOSPITAL_BASED_OUTPATIENT_CLINIC_OR_DEPARTMENT_OTHER): Payer: Self-pay

## 2024-03-17 NOTE — Telephone Encounter (Signed)
 Called and spoke with patient. Reviewed recent Lung CT results. She will complete an annual Lung CT again next year. She is on stain therapy. Order placed. Results and plan to PCP.

## 2024-03-17 NOTE — Telephone Encounter (Signed)
 Copied from CRM [id number]. Topic: Clinical - Lab/Test Results >> Mar 04, 2024  1:41 PM Jessica Lam wrote: Reason for CRM:   Pt is contacting clinic regarding results for CT of chest for LCS, performed on 8/18. Advised the results have not been received and provided expected timeline. Advised clinic would contact her once the results were read by provider to review.   No further questions >> Mar 17, 2024  8:28 AM Jessica Lam wrote: Patient is requesting a call back from Dr. Annella or  Lauraine Mead nurse as her CT results from 8/18 are available.

## 2024-04-01 ENCOUNTER — Ambulatory Visit
Admission: RE | Admit: 2024-04-01 | Discharge: 2024-04-01 | Disposition: A | Discharge status: Home / Self Care | Payer: Medicare HMO | Source: Ambulatory Visit | Attending: Family | Admitting: Family

## 2024-04-01 DIAGNOSIS — Z1231 Encounter for screening mammogram for malignant neoplasm of breast: Secondary | ICD-10-CM
